# Patient Record
Sex: Female | Born: 1960 | Race: Black or African American | Hispanic: Refuse to answer | Marital: Married | Smoking: Never smoker
Health system: Southern US, Community
[De-identification: ages and names within clinical notes are randomized; demographics above are authoritative.]

## PROBLEM LIST (undated history)

## (undated) DIAGNOSIS — T7840XA Allergy, unspecified, initial encounter: Secondary | ICD-10-CM

## (undated) DIAGNOSIS — I10 Essential (primary) hypertension: Secondary | ICD-10-CM

## (undated) DIAGNOSIS — J45909 Unspecified asthma, uncomplicated: Secondary | ICD-10-CM

## (undated) DIAGNOSIS — R7302 Impaired glucose tolerance (oral): Secondary | ICD-10-CM

## (undated) HISTORY — DX: Allergy, unspecified, initial encounter: T78.40XA

## (undated) HISTORY — DX: Unspecified asthma, uncomplicated: J45.909

## (undated) HISTORY — DX: Impaired glucose tolerance (oral): R73.02

---

## 1997-11-02 ENCOUNTER — Other Ambulatory Visit: Admission: RE | Admit: 1997-11-02 | Discharge: 1997-11-02 | Payer: Self-pay | Admitting: Family Medicine

## 1998-03-21 ENCOUNTER — Encounter: Admission: RE | Admit: 1998-03-21 | Discharge: 1998-06-19 | Payer: Self-pay | Admitting: Family Medicine

## 1998-07-02 ENCOUNTER — Ambulatory Visit (HOSPITAL_COMMUNITY): Admission: RE | Admit: 1998-07-02 | Discharge: 1998-07-02 | Payer: Self-pay | Admitting: Family Medicine

## 1998-07-02 ENCOUNTER — Encounter: Payer: Self-pay | Admitting: Family Medicine

## 1998-08-13 ENCOUNTER — Encounter: Payer: Self-pay | Admitting: Family Medicine

## 1998-08-13 ENCOUNTER — Ambulatory Visit (HOSPITAL_COMMUNITY): Admission: RE | Admit: 1998-08-13 | Discharge: 1998-08-13 | Payer: Self-pay | Admitting: Family Medicine

## 1999-02-11 ENCOUNTER — Emergency Department (HOSPITAL_COMMUNITY): Admission: EM | Admit: 1999-02-11 | Discharge: 1999-02-11 | Payer: Self-pay | Admitting: Emergency Medicine

## 2000-01-09 ENCOUNTER — Emergency Department (HOSPITAL_COMMUNITY): Admission: EM | Admit: 2000-01-09 | Discharge: 2000-01-09 | Payer: Self-pay | Admitting: Emergency Medicine

## 2000-01-09 ENCOUNTER — Encounter: Payer: Self-pay | Admitting: Emergency Medicine

## 2000-11-24 ENCOUNTER — Ambulatory Visit (HOSPITAL_COMMUNITY): Admission: RE | Admit: 2000-11-24 | Discharge: 2000-11-24 | Payer: Self-pay | Admitting: Gastroenterology

## 2001-06-08 ENCOUNTER — Other Ambulatory Visit: Admission: RE | Admit: 2001-06-08 | Discharge: 2001-06-08 | Payer: Self-pay | Admitting: Family Medicine

## 2002-11-06 ENCOUNTER — Encounter: Payer: Self-pay | Admitting: Family Medicine

## 2002-11-06 ENCOUNTER — Ambulatory Visit (HOSPITAL_COMMUNITY): Admission: RE | Admit: 2002-11-06 | Discharge: 2002-11-06 | Payer: Self-pay | Admitting: Family Medicine

## 2003-12-20 ENCOUNTER — Other Ambulatory Visit: Admission: RE | Admit: 2003-12-20 | Discharge: 2003-12-20 | Payer: Self-pay | Admitting: Obstetrics and Gynecology

## 2004-07-03 ENCOUNTER — Emergency Department (HOSPITAL_COMMUNITY): Admission: EM | Admit: 2004-07-03 | Discharge: 2004-07-03 | Payer: Self-pay | Admitting: *Deleted

## 2005-08-13 ENCOUNTER — Other Ambulatory Visit: Admission: RE | Admit: 2005-08-13 | Discharge: 2005-08-13 | Payer: Self-pay | Admitting: Family Medicine

## 2007-07-30 ENCOUNTER — Emergency Department (HOSPITAL_COMMUNITY): Admission: EM | Admit: 2007-07-30 | Discharge: 2007-07-31 | Payer: Self-pay | Admitting: Emergency Medicine

## 2008-03-11 ENCOUNTER — Emergency Department (HOSPITAL_COMMUNITY): Admission: EM | Admit: 2008-03-11 | Discharge: 2008-03-11 | Payer: Self-pay | Admitting: Emergency Medicine

## 2008-05-21 ENCOUNTER — Emergency Department (HOSPITAL_COMMUNITY): Admission: EM | Admit: 2008-05-21 | Discharge: 2008-05-21 | Payer: Self-pay | Admitting: Family Medicine

## 2009-07-17 IMAGING — CT CT ABDOMEN W/ CM
1 of 3 series · 14 of 32 positions shown, 19 images · IV contrast (agent unspecified)
Comparison: None

CT ABDOMEN

CLINICAL DATA: Lower abdominal pain

CT ABDOMEN AND PELVIS WITH CONTRAST
TECHNIQUE: Multidetector CT imaging of the abdomen and pelvis was
performed using the standard protocol following bolus
administration of intravenous contrast.
Contrast: 100 ml Lmnipaque-0TT

[Series 2: abd_pel 5.0 b40f st · axial · 0.71mm/px · z∈[+760,+1155]mm · 14 of 89 slices shown, 19 images]
[im 5/89  soft-tissue]
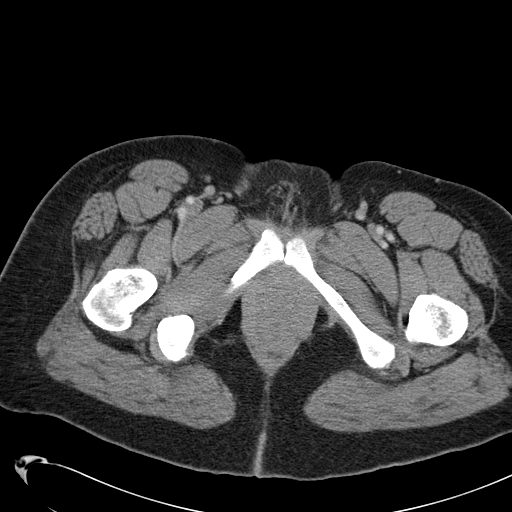
[im 5/89  bone]
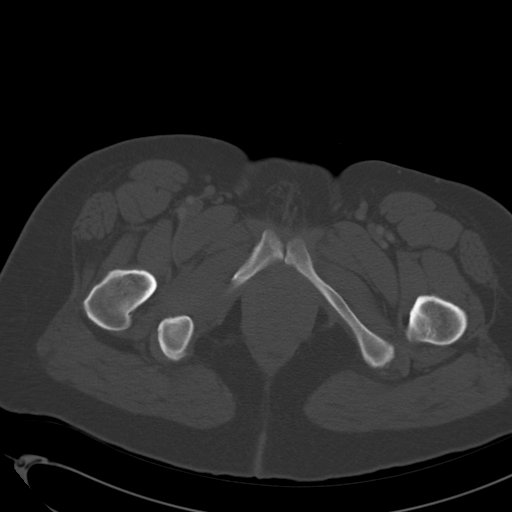
[im 14/89  soft-tissue]
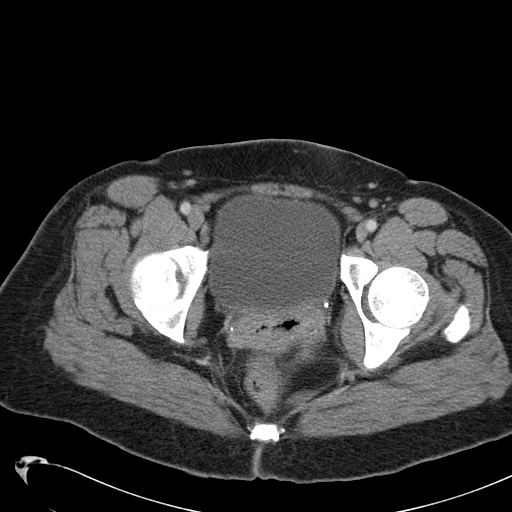
[im 19/89  soft-tissue]
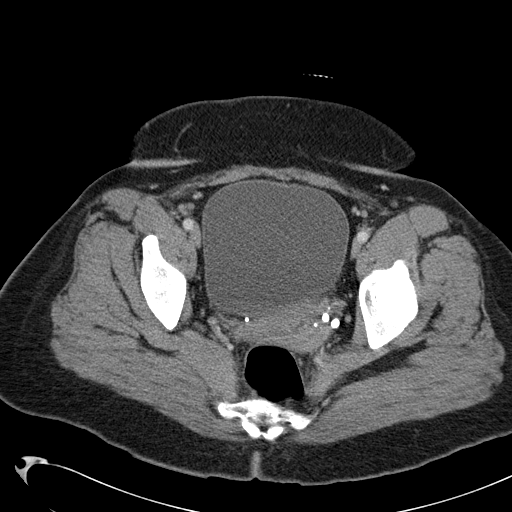
[im 24/89  soft-tissue]
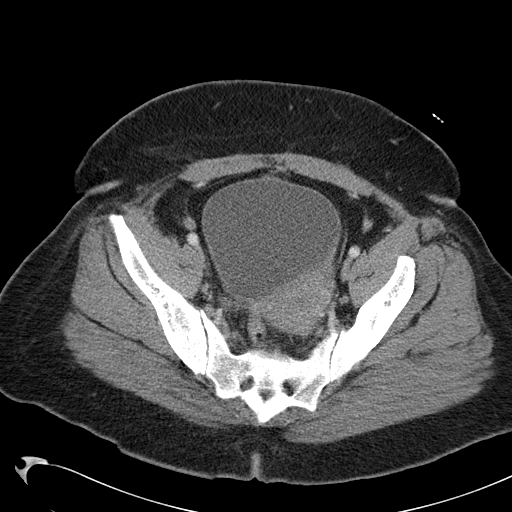
[im 33/89  soft-tissue]
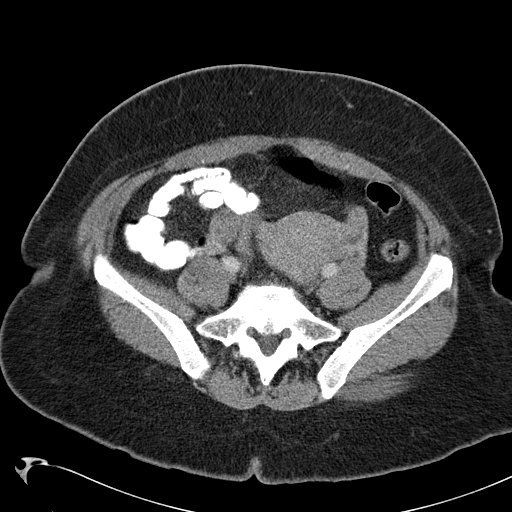
[im 38/89  soft-tissue]
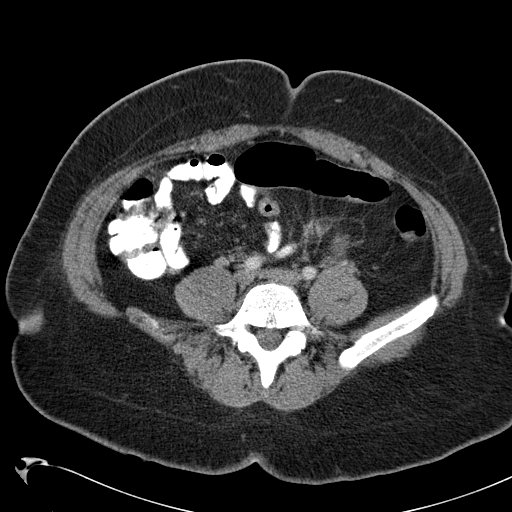
[im 47/89  soft-tissue]
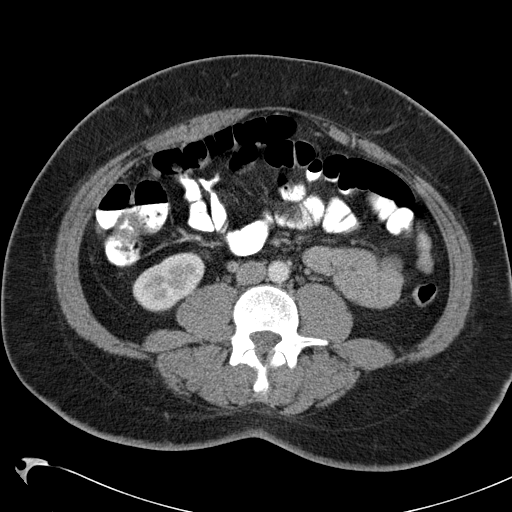
[im 51/89  soft-tissue]
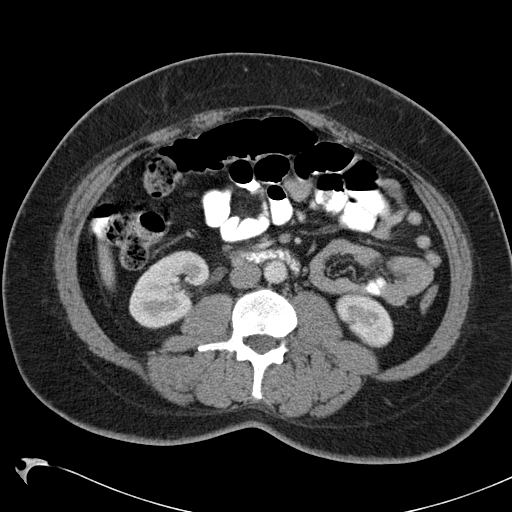
[im 56/89  soft-tissue]
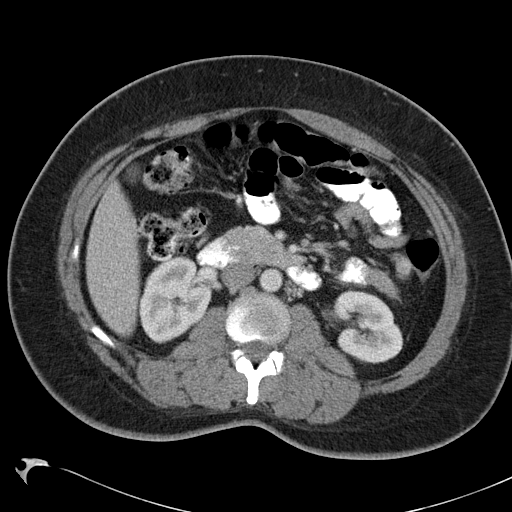
[im 56/89  bone]
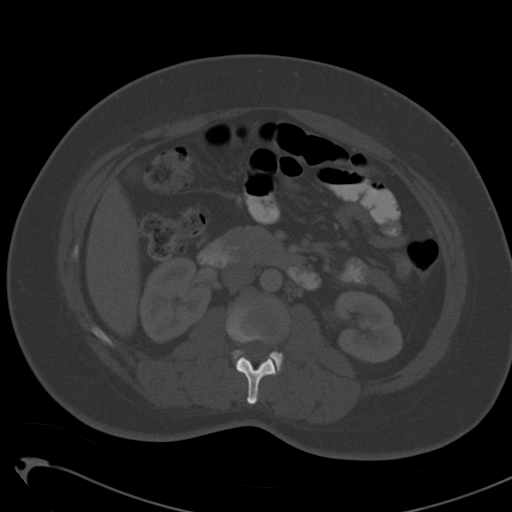
[im 65/89  soft-tissue]
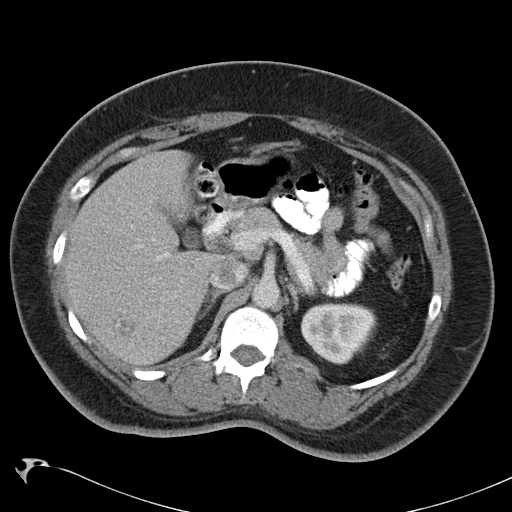
[im 70/89  soft-tissue]
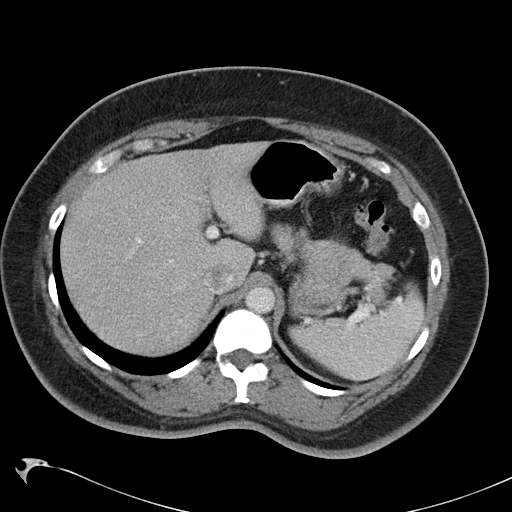
[im 70/89  lung]
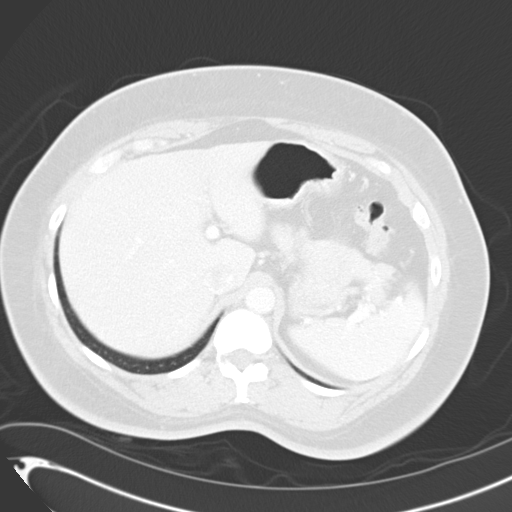
[im 75/89  soft-tissue]
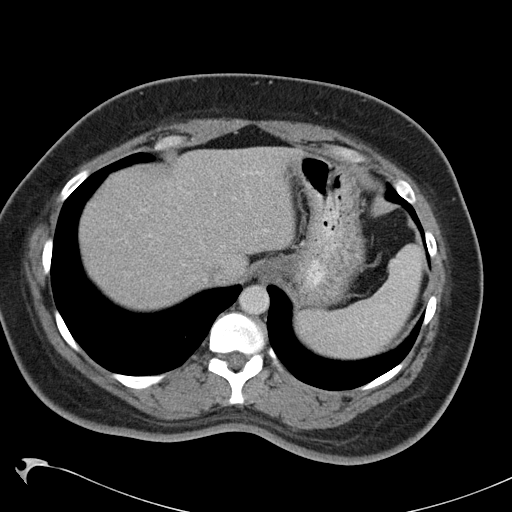
[im 75/89  lung]
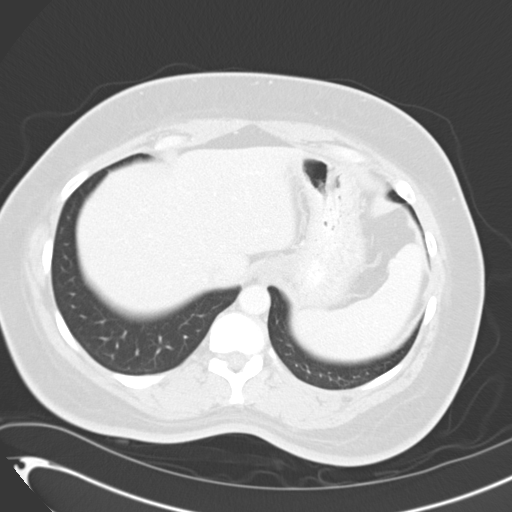
[im 79/89  lung]
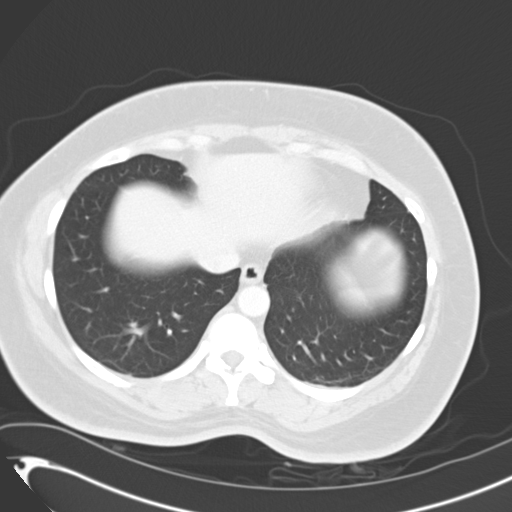
[im 84/89  soft-tissue]
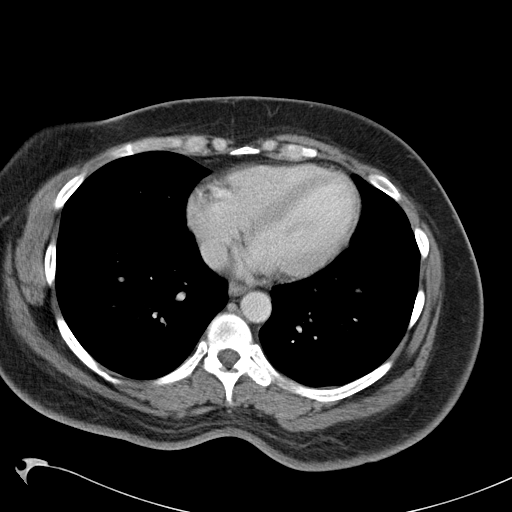
[im 84/89  lung]
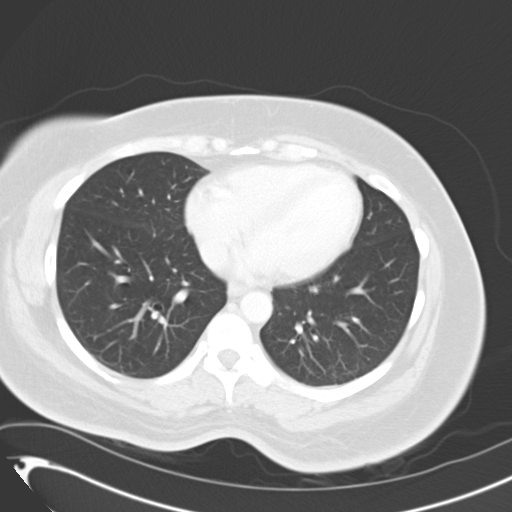

[14 of 32 positions shown; findings below may reference images not displayed]

FINDINGS: The lung bases are clear.  There is a small lesion in
the right hepatic lobe posteriorly.  This has the  CT appearance of
a benign hepatic hemangioma. No biliary dilatation.  The
gallbladder appears normal.  The pancreas is normal.  The spleen is
normal in size.  The adrenal glands and kidneys are unremarkable.

The aorta is normal in caliber.  The major branch vessels are
normal.

The stomach is not well distended but no gross abnormalities are
seen.  The duodenum, small bowel and colon are unremarkable.  No
mesenteric or retroperitoneal masses or adenopathy.  Have the
appendix is visualized and is normal.  No significant bony
findings.  Degenerative change noted at the last open interspace in
the lumbar spine.
IMPRESSION: 1.  No acute abdominal findings, mass lesions or adenopathy.
2.  Small liver lesion is most likely a benign hemangioma.  MRI
examination without and with contrast would be confirmatory.

CT PELVIS
FINDINGS: The rectum, sigmoid colon and visualized small bowel
loops are unremarkable.  The uterus and ovaries demonstrate no
significant findings.  Small cysts are noted bilaterally.  The
bladder is mildly distended.  Parametrial calcifications are noted.
No inguinal mass or hernia.  The bony pelvis is intact.
IMPRESSION: 1.  No acute pelvic findings, mass lesions or adenopathy.
2.  Mild bladder distension.

## 2010-07-29 ENCOUNTER — Ambulatory Visit (HOSPITAL_COMMUNITY)
Admission: RE | Admit: 2010-07-29 | Discharge: 2010-07-29 | Payer: Self-pay | Source: Home / Self Care | Attending: Chiropractic Medicine | Admitting: Chiropractic Medicine

## 2010-12-04 NOTE — Procedures (Signed)
[place]  Patient:    Tiffany Contreras, Tiffany Contreras                    MRN: [id number] Proc. Date: 11/24/00 Attending:  Verlin Grills, M.D. CC:         Stacie Acres. Cliffton Asters, M.D., [facility]                           Procedure Report  PROCEDURE:  Colonoscopy.  REFERRING PHYSICIAN:  Stacie Acres. Cliffton Asters, M.D., [facility], [address] [place], [address], [place], [place] [address].  PROCEDURE INDICATION:  Tiffany Contreras (date of birth September 17, 1960) is a 50 year old female who had an episode of painless hematochezia.  I discussed with her the complications associated with colonoscopy and polypectomy, including intestinal bleeding and intestinal perforation.  Ms. Verdell has signed the operative permit.  ENDOSCOPIST:  Verlin Grills, M.D.  PREMEDICATION:  Fentanyl 50 mcg, Versed 5 mg.  ENDOSCOPE:  Olympus pediatric colonoscope.  DESCRIPTION OF PROCEDURE:  After obtaining informed consent, the patient was placed in the left lateral decubitus position.  I administered intravenous fentanyl and intravenous Versed to achieve conscious sedation for the procedure.  The patients blood pressure, oxygen saturation, and cardiac rhythm were monitored throughout the procedure and documented in the medical record.  Anal inspection was normal.  Digital rectal exam was normal.  The Olympus pediatric video colonoscope was introduced into the rectum and under direct vision advanced to the cecum, as identified by a normal-appearing ileocecal valve.  The ileocecal valve was intubated and the distal ileum inspected. Colonic preparation for the exam today was excellent.  Rectum normal.  Sigmoid colon and descending colon normal.  Splenic flexure normal.  Transverse colon normal.  Hepatic flexure normal.  Ascending colon normal.  Cecum and ileocecal valve normal.  Distal ileum  normal.  ASSESSMENT:  Normal proctocolonoscopy to the cecum with intubation of the ileocecal valve and distal ileal inspection. DD:  11/24/00 TD:  11/24/00 Job: [id number]

## 2011-03-05 ENCOUNTER — Other Ambulatory Visit: Payer: Self-pay | Admitting: Emergency Medicine

## 2011-08-24 ENCOUNTER — Ambulatory Visit (INDEPENDENT_AMBULATORY_CARE_PROVIDER_SITE_OTHER): Payer: BC Managed Care – PPO | Admitting: Family Medicine

## 2011-08-24 VITALS — BP 142/86 | HR 77 | Temp 97.8°F | Resp 16 | Ht 62.0 in | Wt 184.8 lb

## 2011-08-24 DIAGNOSIS — G4700 Insomnia, unspecified: Secondary | ICD-10-CM

## 2011-08-24 DIAGNOSIS — F411 Generalized anxiety disorder: Secondary | ICD-10-CM

## 2011-08-24 DIAGNOSIS — G43909 Migraine, unspecified, not intractable, without status migrainosus: Secondary | ICD-10-CM

## 2011-08-24 DIAGNOSIS — F419 Anxiety disorder, unspecified: Secondary | ICD-10-CM

## 2011-08-24 MED ORDER — ELETRIPTAN HYDROBROMIDE 20 MG PO TABS: ORAL_TABLET | ORAL | Status: DC

## 2011-08-24 MED ORDER — CITALOPRAM HYDROBROMIDE 20 MG PO TABS: 20.0000 mg | ORAL_TABLET | Freq: Every day | ORAL | Status: DC

## 2011-08-24 NOTE — Progress Notes (Signed)
  Subjective:    Patient ID: Tiffany Contreras, female    DOB: [birth year], 51 y.o.   MRN: [id number]  HPI 51 yo female with GI complaints for 3 days (started Sunday).  Diarrhea, nausea, vomitting.  That stopped yesterday.  Then rectal bleeding yesterday and this morning.  Paitent not worried.  Last colonscopy 1 year ago - normal.  History of hemorrhoids and fissures.  Patient actually not concerned about these symptoms.  Actually wants to discuss:  Today worried about: 1) Anxiety, insomnia -  In march was prescribed prozac and klonopn.  Never took.  Mother in law told her it could cause seizures and she thought they would "maker her crazy". Still has a lot of stress and anxiety.  Makes her irritable and on edge.  Can't fall asleep and wakes up multiple times a night.  Does not want anything "addicting".   2) Migraines - usual with menses, every few months.  Relpax helped a lot.  Out.  Hasn't had in over a year.     Review of Systems Negative except as per HPI     Objective:   Physical Exam  Constitutional: She appears well-developed and well-nourished.  Cardiovascular: Normal rate, regular rhythm, normal heart sounds and intact distal pulses.   Pulmonary/Chest: Effort normal and breath sounds normal.  Neurological: She is alert.  Psychiatric: Her speech is normal. Her mood appears anxious. She is withdrawn.          Assessment & Plan:  Anxiety - celexa.  F/u 4-6 weeks Insomnia - try melatonin.  If not helpful, RTC Migrain - relpax.

## 2012-01-21 ENCOUNTER — Ambulatory Visit (INDEPENDENT_AMBULATORY_CARE_PROVIDER_SITE_OTHER): Payer: BC Managed Care – PPO | Admitting: Emergency Medicine

## 2012-01-21 VITALS — BP 138/88 | HR 85 | Temp 98.1°F | Resp 18 | Ht 63.0 in | Wt 188.0 lb

## 2012-01-21 DIAGNOSIS — H00019 Hordeolum externum unspecified eye, unspecified eyelid: Secondary | ICD-10-CM

## 2012-01-21 MED ORDER — ERYTHROMYCIN 5 MG/GM OP OINT: TOPICAL_OINTMENT | Freq: Every day | OPHTHALMIC | Status: AC

## 2012-01-21 NOTE — Progress Notes (Signed)
  Subjective:    Patient ID: Tiffany Contreras, female    DOB: January 08, 1961, 51 y.o.   MRN: [id number]  HPI Pt's left eye has sharp pain.  A few days later she noticed a bump on her lower lid.  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  Pt currently has no pain in eye for about one week.     Review of Systems     Objective:   Physical Exam there is a 2 x 3 mm raised in left mid and lower lid. Pupils are equal reactive to light. Lid eversion is negative. The disc is normal .        Assessment & Plan:  Patient is a side left lower lid we'll treat with erythromycin ointment .

## 2012-01-27 ENCOUNTER — Telehealth: Payer: Self-pay

## 2012-01-27 NOTE — Telephone Encounter (Signed)
PT STATES SHE WAS SEEN FOR HER EYE AND WE WERE GOING TO CALL IN SOME DROPS FOR HER, BUT THE PHARMACY NEVER RECEIVED THE REQUEST PLEASE CALL [phone] AND LEAVE ON VOICE MAIL  [facility] [address]

## 2012-01-28 ENCOUNTER — Telehealth: Payer: Self-pay

## 2012-01-28 NOTE — Telephone Encounter (Signed)
Called pharmacy to check status of Rx and was told they did receive Rx and it was on hold. Asked them to fill Rx for pt and to clarify on sig that pt is to apply TID, not just at bedtime. LMOM for pt that the pharmacy had the Rx on hold and is filling it for her.

## 2012-01-29 ENCOUNTER — Other Ambulatory Visit: Payer: Self-pay | Admitting: Family Medicine

## 2012-05-17 ENCOUNTER — Other Ambulatory Visit: Payer: Self-pay | Admitting: Family Medicine

## 2012-06-03 ENCOUNTER — Emergency Department (HOSPITAL_COMMUNITY): Payer: BC Managed Care – PPO

## 2012-06-03 ENCOUNTER — Emergency Department (HOSPITAL_COMMUNITY)
Admission: EM | Admit: 2012-06-03 | Discharge: 2012-06-03 | Disposition: A | Discharge status: Home / Self Care | Payer: BC Managed Care – PPO | Attending: Emergency Medicine | Admitting: Emergency Medicine

## 2012-06-03 ENCOUNTER — Encounter (HOSPITAL_COMMUNITY): Payer: Self-pay | Admitting: Emergency Medicine

## 2012-06-03 DIAGNOSIS — Y9301 Activity, walking, marching and hiking: Secondary | ICD-10-CM | POA: Insufficient documentation

## 2012-06-03 DIAGNOSIS — Y9289 Other specified places as the place of occurrence of the external cause: Secondary | ICD-10-CM | POA: Insufficient documentation

## 2012-06-03 DIAGNOSIS — W010XXA Fall on same level from slipping, tripping and stumbling without subsequent striking against object, initial encounter: Secondary | ICD-10-CM | POA: Insufficient documentation

## 2012-06-03 DIAGNOSIS — S060XAA Concussion with loss of consciousness status unknown, initial encounter: Secondary | ICD-10-CM | POA: Insufficient documentation

## 2012-06-03 DIAGNOSIS — I10 Essential (primary) hypertension: Secondary | ICD-10-CM | POA: Insufficient documentation

## 2012-06-03 DIAGNOSIS — S060X9A Concussion with loss of consciousness of unspecified duration, initial encounter: Secondary | ICD-10-CM | POA: Insufficient documentation

## 2012-06-03 HISTORY — DX: Essential (primary) hypertension: I10

## 2012-06-03 LAB — URINALYSIS, ROUTINE W REFLEX MICROSCOPIC
Bilirubin Urine: NEGATIVE
Leukocytes, UA: NEGATIVE
Nitrite: NEGATIVE
Specific Gravity, Urine: 1.016 (ref 1.005–1.030)
Urobilinogen, UA: 0.2 mg/dL (ref 0.0–1.0)
pH: 7.5 (ref 5.0–8.0)

## 2012-06-03 MED ORDER — OXYCODONE-ACETAMINOPHEN 5-325 MG PO TABS
1.0000 | ORAL_TABLET | Freq: Once | ORAL | Status: AC
  Administered 2012-06-03: 1 via ORAL
  Filled 2012-06-03: qty 1

## 2012-06-03 MED ORDER — OXYCODONE-ACETAMINOPHEN 5-325 MG PO TABS: 1.0000 | ORAL_TABLET | Freq: Four times a day (QID) | ORAL | Status: DC | PRN

## 2012-06-03 NOTE — ED Notes (Signed)
ZOX:WRUE<AV> Expected date:<BR> Expected time:<BR> Means of arrival:<BR> Comments:<BR> Hold for triage 1

## 2012-06-03 NOTE — ED Provider Notes (Signed)
History     CSN: [id number]  Arrival date & time 06/03/12  1752   First MD Initiated Contact with Patient 06/03/12 1930      Chief Complaint  Patient presents with  . Fall  . Headache    (Consider location/radiation/quality/duration/timing/severity/associated sxs/prior treatment) Patient is a 51 y.o. female presenting with fall and headaches. The history is provided by the patient.  Fall Associated symptoms include headaches. Pertinent negatives include no numbness, no abdominal pain, no nausea and no vomiting.  Headache  Pertinent negatives include no shortness of breath, no nausea and no vomiting.   on patient states that she slipped wall walking downhill on Thursday and fell back hitting her head. She states she got days at the time and felt a little weak. Since she's developed headache and some photophobia. No numbness or weakness. Her headaches have not improved. No vomiting. No nausea since. The headache is on the back right side her head and goes forward. No neck pain. No chest or abdominal pain. She is not on anticoagulation  Past Medical History  Diagnosis Date  . Hypertension     Past Surgical History  Procedure Date  . Bladder repair w/ cesarean section   . Cesarean section     NO BLADDER REPAIR    No family history on file.  History  Substance Use Topics  . Smoking status: Never Smoker   . Smokeless tobacco: Not on file  . Alcohol Use: Not on file    OB History    Grav Para Term Preterm Abortions TAB SAB Ect Mult Living                  Review of Systems  Constitutional: Negative for activity change and appetite change.  HENT: Negative for neck stiffness.   Eyes: Positive for photophobia. Negative for pain.  Respiratory: Negative for chest tightness and shortness of breath.   Cardiovascular: Negative for chest pain and leg swelling.  Gastrointestinal: Negative for nausea, vomiting, abdominal pain and diarrhea.  Genitourinary: Negative for flank  pain.  Musculoskeletal: Negative for back pain.  Skin: Negative for rash.  Neurological: Positive for headaches. Negative for weakness and numbness.  Psychiatric/Behavioral: Negative for behavioral problems.    Allergies  Penicillins  Home Medications   Current Outpatient Rx  Name  Route  Sig  Dispense  Refill  . TRIAMTERENE-HCTZ 37.5-25 MG PO TABS   Oral   Take 1 tablet by mouth daily.         . OXYCODONE-ACETAMINOPHEN 5-325 MG PO TABS   Oral   Take 1-2 tablets by mouth every 6 (six) hours as needed for pain.   8 tablet   0     BP 169/85  Pulse 87  SpO2 100%  Physical Exam  Nursing note and vitals reviewed. Constitutional: She is oriented to person, place, and time. She appears well-developed and well-nourished.  HENT:  Head: Normocephalic and atraumatic.  Eyes: EOM are normal. Pupils are equal, round, and reactive to light.  Neck: Normal range of motion. Neck supple.  Cardiovascular: Normal rate, regular rhythm and normal heart sounds.   No murmur heard. Pulmonary/Chest: Effort normal and breath sounds normal. No respiratory distress. She has no wheezes. She has no rales.  Abdominal: Soft. Bowel sounds are normal. She exhibits no distension. There is no tenderness. There is no rebound and no guarding.  Musculoskeletal: Normal range of motion.       Mild tenderness to left buttock.  Neurological: She is  alert and oriented to person, place, and time. No cranial nerve deficit.  Skin: Skin is warm and dry.  Psychiatric: She has a normal mood and affect. Her speech is normal.    ED Course  Procedures (including critical care time)   Labs Reviewed  URINALYSIS, ROUTINE W REFLEX MICROSCOPIC   Ct Head Wo Contrast  06/03/2012  *RADIOLOGY REPORT*  Clinical Data: Larey Seat 2 days ago.  Continued headache with photophobia.  CT HEAD WITHOUT CONTRAST  Technique:  Contiguous axial images were obtained from the base of the skull through the vertex without contrast.  Comparison:  None.  Findings: There is no evidence for acute infarction, intracranial hemorrhage, mass lesion, hydrocephalus, or extra-axial fluid. There is no atrophy or white matter disease.  The calvarium is intact. No visible scalp hematoma.  Clear sinuses and mastoids.  IMPRESSION: Negative.   Original Report Authenticated By: Davonna Belling, M.D.      1. Concussion       MDM  Patient with headache and photophobia after hitting her head on Thursday. Negative head CT. Likely concussion        Juliet Rude. Rubin Payor, MD 06/04/12 7425

## 2012-06-03 NOTE — ED Notes (Signed)
Slid down hill on Thursday, hit back of head on ground-- no LOC, but states was "blurry eyed" for a short time, still has a severe headache--

## 2012-06-08 ENCOUNTER — Ambulatory Visit (INDEPENDENT_AMBULATORY_CARE_PROVIDER_SITE_OTHER): Payer: BC Managed Care – PPO | Admitting: Emergency Medicine

## 2012-06-08 VITALS — BP 146/88 | HR 108 | Temp 98.1°F | Resp 18 | Ht 62.5 in | Wt 187.0 lb

## 2012-06-08 DIAGNOSIS — G44309 Post-traumatic headache, unspecified, not intractable: Secondary | ICD-10-CM

## 2012-06-08 MED ORDER — NAPROXEN SODIUM 550 MG PO TABS: 550.0000 mg | ORAL_TABLET | Freq: Two times a day (BID) | ORAL | Status: DC

## 2012-06-08 MED ORDER — EPINEPHRINE 0.3 MG/0.3ML IJ DEVI: 0.3000 mg | Freq: Once | INTRAMUSCULAR | Status: DC

## 2012-06-08 NOTE — Progress Notes (Signed)
[facility] [address] [place] [address] [phone]  Date:  06/08/2012   Name:  Tiffany Contreras   DOB:  03-24-1961   MRN:  [id number]  PCP:  No primary provider on file.    Chief Complaint: Follow-up and Medication Refill   History of Present Illness:  Tiffany Contreras is a 51 y.o. very pleasant female patient who presents with the following:  Slipped and fell and hit head last Thursday.  Was seen in ER Saturday and had a normal exam and CT of head at that time and discharged on percocet for headache.  Unable to tolerate percocet and says that it did not relieve her headache.  Now has headache that is improved and and not relieved with ibuprofen.  Denies photophobia or other neuro complaints.  Had some nausea and vomiting a two days ago that has resolved.  Says that she had her "throat close up" while taking the percocet and her son is allergic to the drug. No sore throat, fever or chills, cough or coryza.  No difficulty with gait balance or coordination or speech or vision.  There is no problem list on file for this patient.   Past Medical History  Diagnosis Date  . Hypertension     Past Surgical History  Procedure Date  . Bladder repair w/ cesarean section   . Cesarean section     NO BLADDER REPAIR    History  Substance Use Topics  . Smoking status: Never Smoker   . Smokeless tobacco: Not on file  . Alcohol Use: Not on file    No family history on file.  Allergies  Allergen Reactions  . Penicillins     unknown    Medication list has been reviewed and updated.  Current Outpatient Prescriptions on File Prior to Visit  Medication Sig Dispense Refill  . oxyCODONE-acetaminophen (PERCOCET/ROXICET) 5-325 MG per tablet Take 1-2 tablets by mouth every 6 (six) hours as needed for pain.  8 tablet  0  . triamterene-hydrochlorothiazide (MAXZIDE-25) 37.5-25 MG per tablet Take 1 tablet by mouth daily.        Review of Systems:  As per  HPI, otherwise negative.    Physical Examination: Filed Vitals:   06/08/12 0819  BP: 146/88  Pulse: 108  Temp: 98.1 F (36.7 C)  Resp: 18   Filed Vitals:   06/08/12 0819  Height: 5' 2.5" (1.588 m)  Weight: 187 lb (84.823 kg)   Body mass index is 33.66 kg/(m^2). Ideal Body Weight: Weight in (lb) to have BMI = 25: 138.6   GEN: WDWN, NAD, Non-toxic, A & O x 3.  No rash or sepsis.   HEENT: Atraumatic, Normocephalic. Neck supple. No masses, No LAD.  PRRERLA EOMI  Fundi normal  Oropharynx negative Ears and Nose: No external deformity.  TM negative CV: RRR, No M/G/R. No JVD. No thrill. No extra heart sounds. PULM: CTA B, no wheezes, crackles, rhonchi. No retractions. No resp. distress. No accessory muscle use. ABD: S, NT, ND, +BS. No rebound. No HSM. EXTR: No c/c/e NEURO Normal gait. CN 2-12 intact.   PSYCH: Normally interactive. Conversant. Not depressed or anxious appearing.  Calm demeanor.    Assessment and Plan: Post concussion headache Anaprox Follow up as needed  Carmelina Dane, MD

## 2012-06-12 ENCOUNTER — Encounter (HOSPITAL_COMMUNITY): Payer: Self-pay | Admitting: *Deleted

## 2012-06-12 ENCOUNTER — Emergency Department (HOSPITAL_COMMUNITY)
Admission: EM | Admit: 2012-06-12 | Discharge: 2012-06-13 | Disposition: A | Discharge status: Home / Self Care | Payer: BC Managed Care – PPO | Attending: Emergency Medicine | Admitting: Emergency Medicine

## 2012-06-12 DIAGNOSIS — F0781 Postconcussional syndrome: Secondary | ICD-10-CM | POA: Insufficient documentation

## 2012-06-12 DIAGNOSIS — Z79899 Other long term (current) drug therapy: Secondary | ICD-10-CM | POA: Insufficient documentation

## 2012-06-12 DIAGNOSIS — I10 Essential (primary) hypertension: Secondary | ICD-10-CM

## 2012-06-12 NOTE — ED Notes (Signed)
Pt states she was seen 11/16 for head injury and sent home on percocet, states she then had an allergic reaction to it and started taking naproxen and thought it "messed" with her also. Pt states since that time her blood pressure has been elevated and is c/o headaches.

## 2012-06-13 MED ORDER — IBUPROFEN 800 MG PO TABS: 800.0000 mg | ORAL_TABLET | Freq: Three times a day (TID) | ORAL | Status: DC | PRN

## 2012-06-13 MED ORDER — IBUPROFEN 800 MG PO TABS
800.0000 mg | ORAL_TABLET | Freq: Once | ORAL | Status: AC
  Administered 2012-06-13: 800 mg via ORAL
  Filled 2012-06-13: qty 1

## 2012-06-13 NOTE — ED Provider Notes (Signed)
History     CSN: [id number]  Arrival date & time 06/12/12  2015   First MD Initiated Contact with Patient 06/13/12 0017      Chief Complaint  Patient presents with  . Hypertension    (Consider location/radiation/quality/duration/timing/severity/associated sxs/prior treatment) HPI Is a 51 year old female who was seen on the 16th of this month for a head injury. She was diagnosed with concussion and sent home on Percocet. He states that Percocet made her itch and she stopped taking it. She saw her primary care physician who put her on Anaprox but she states that made her itching as well. She has not taken it in about 3 days. She continues to have periodic headaches but is no longer having dizzy spells like she had soon after the accident. He has had some headaches or moderate to severe but states her headache is mild to moderate now. She took her blood pressure earlier found to be 186/100. That was her principal reason for being evaluated. It is noted that her blood pressure was 170/97 in triage. She continues to take her Maxzide. She denies chest pain, shortness of breath, visual changes or neurologic symptoms at the present time. She had been treating her itching with Benadryl.  Past Medical History  Diagnosis Date  . Hypertension     Past Surgical History  Procedure Date  . Bladder repair w/ cesarean section   . Cesarean section     NO BLADDER REPAIR    History reviewed. No pertinent family history.  History  Substance Use Topics  . Smoking status: Never Smoker   . Smokeless tobacco: Not on file  . Alcohol Use: Not on file    OB History    Grav Para Term Preterm Abortions TAB SAB Ect Mult Living                  Review of Systems  All other systems reviewed and are negative.    Allergies  Anaprox; Penicillins; and Percocet  Home Medications   Current Outpatient Rx  Name  Route  Sig  Dispense  Refill  . EPINEPHRINE 0.3 MG/0.3ML IJ DEVI   Intramuscular  Inject 0.3 mLs (0.3 mg total) into the muscle once.   2 Device   2   . IBUPROFEN 200 MG PO TABS   Oral   Take 400 mg by mouth every 6 (six) hours as needed. Pain         . NAPROXEN SODIUM 550 MG PO TABS   Oral   Take 550 mg by mouth 2 (two) times daily with a meal.         . OXYCODONE-ACETAMINOPHEN 5-325 MG PO TABS   Oral   Take 1-2 tablets by mouth every 6 (six) hours as needed for pain.   8 tablet   0   . TRIAMTERENE-HCTZ 37.5-25 MG PO TABS   Oral   Take 1 tablet by mouth daily.           BP 170/97  Pulse 90  Temp 98.5 F (36.9 C) (Oral)  Resp 20  SpO2 100%  LMP 06/01/2012  Physical Exam General: Well-developed, well-nourished female in no acute distress; appearance consistent with age of record HENT: normocephalic, atraumatic Eyes: pupils equal round and reactive to light; extraocular muscles intact Neck: supple Heart: regular rate and rhythm Lungs: clear to auscultation bilaterally Abdomen: soft; nondistended Extremities: No deformity; full range of motion; pulses normal; no edema Neurologic: Awake, alert and oriented; motor function intact in  all extremities and symmetric; no facial droop Skin: Warm and dry Psychiatric: Normal mood and affect    ED Course  Procedures (including critical care time)    MDM  12:27 AM Patient states she can tolerate the 800 mg ibuprofen tablets. She requests a prescription for same. She was advised that NSAIDs can cause some pupils blood pressure to be elevated. She was advised to monitor this and followup with her primary care physician.        Hanley Seamen, MD 06/13/12 [phone]

## 2012-06-14 ENCOUNTER — Telehealth: Payer: Self-pay

## 2012-06-14 NOTE — Telephone Encounter (Signed)
PT WAS SEEN LAST Thursday FOR A FOLLOW UP ON A CONCUSSION.  EVER SINCE SHE HAD THE HEAD INJURY HER BP HAS BEEN ELEVATED.  SHE SAYS SHE TOLD THE DOCTOR AT THE TIME ABOUT IT.  SHE WANTS TO KNOW IF SHE SHOULD COME IN FOR BLOOD WORK OR ANYTHING IN REGARDS TO THIS.  SAID NO BLOOD WAS TAKEN LAST Thursday.  PLEASE CALL [phone]

## 2012-06-14 NOTE — Telephone Encounter (Signed)
She states she was seen in ER for this, I advised her she does need to be seen for elevated blood pressure.

## 2012-06-17 ENCOUNTER — Encounter: Payer: Self-pay | Admitting: Family Medicine

## 2012-06-17 ENCOUNTER — Ambulatory Visit (INDEPENDENT_AMBULATORY_CARE_PROVIDER_SITE_OTHER): Payer: BC Managed Care – PPO | Admitting: Family Medicine

## 2012-06-17 VITALS — BP 148/94 | HR 94 | Temp 98.6°F | Resp 20 | Ht 63.5 in | Wt 191.6 lb

## 2012-06-17 DIAGNOSIS — F419 Anxiety disorder, unspecified: Secondary | ICD-10-CM

## 2012-06-17 DIAGNOSIS — R079 Chest pain, unspecified: Secondary | ICD-10-CM

## 2012-06-17 DIAGNOSIS — F411 Generalized anxiety disorder: Secondary | ICD-10-CM

## 2012-06-17 DIAGNOSIS — I10 Essential (primary) hypertension: Secondary | ICD-10-CM | POA: Insufficient documentation

## 2012-06-17 DIAGNOSIS — F0781 Postconcussional syndrome: Secondary | ICD-10-CM

## 2012-06-17 MED ORDER — AMLODIPINE BESYLATE 5 MG PO TABS: 5.0000 mg | ORAL_TABLET | Freq: Every day | ORAL | Status: DC

## 2012-06-17 NOTE — Patient Instructions (Signed)
Return in one week for recheck.  If at anytime you're abruptly worse return or go to the emergency room  And amlodipine one tablet daily to your current medication  Work on weight loss and salt restriction, and when you're feeling better start doing some regular walking.

## 2012-06-17 NOTE — Progress Notes (Signed)
Subjective: 51 year old lady who slipped going down a bank at daycare 2 weeks ago and hit the back of her head. She had a concussion. She went to the emergency room. She is persisted in having headaches, right side of her hand behind her right eye. Her blood pressures been running high. She's been back to the emergency room twice, and here in the office once for this. Yesterday she had a numbness in left hand. Last night she had some substernal chest pressure. She gone out to dinner and a few things yesterday. She decided to come in and get checked this morning.  Objective: Anxious appearing. In no acute distress. Eyes PERRLA fundi benign. Neck supple. Chest is clear to auscultation. Heart regular without murmurs. Grip is symmetrical. Finger to nose normal. Romberg negative. Gait good. Patient is fully alert and oriented.  EKG normal. I repeated the blood pressure and got 170/104, then repeated again after the EKG and got 148/94.  On repeat by the nurse blood pressure is down to 140/90.  Assessment: Postconcussion syndrome Hypertension Anxiety  Plan: And the blood pressure medicine Reassurance If in all worse she is to return here to the emergency room. We'll recheck her in one week.

## 2012-06-18 NOTE — Progress Notes (Signed)
Reviewed and agree.

## 2012-09-21 ENCOUNTER — Other Ambulatory Visit: Payer: Self-pay | Admitting: Family Medicine

## 2013-01-10 ENCOUNTER — Ambulatory Visit (INDEPENDENT_AMBULATORY_CARE_PROVIDER_SITE_OTHER): Payer: BC Managed Care – PPO | Admitting: Physician Assistant

## 2013-01-10 VITALS — BP 124/82 | HR 86 | Temp 98.0°F | Resp 18 | Ht 63.0 in | Wt 195.0 lb

## 2013-01-10 DIAGNOSIS — Z124 Encounter for screening for malignant neoplasm of cervix: Secondary | ICD-10-CM

## 2013-01-10 DIAGNOSIS — N760 Acute vaginitis: Secondary | ICD-10-CM

## 2013-01-10 DIAGNOSIS — K649 Unspecified hemorrhoids: Secondary | ICD-10-CM

## 2013-01-10 DIAGNOSIS — K5900 Constipation, unspecified: Secondary | ICD-10-CM

## 2013-01-10 DIAGNOSIS — K625 Hemorrhage of anus and rectum: Secondary | ICD-10-CM

## 2013-01-10 LAB — IFOBT (OCCULT BLOOD): IFOBT: NEGATIVE

## 2013-01-10 MED ORDER — DOCUSATE SODIUM 50 MG PO CAPS: 50.0000 mg | ORAL_CAPSULE | Freq: Two times a day (BID) | ORAL | Status: DC

## 2013-01-10 NOTE — Patient Instructions (Addendum)
Begin taking the Colace (docusate) twice daily to help relieve constipation.  I suspect that the bleeding you experienced last week was due to hemorrhoids.  There is no blood in your stool today, which is reassuring.  If the bleeding returns, I may want you to see GI again to rule out anything higher up in your colon.    You can stop taking the Norvasc (amlodipine), but continue the Maxide daily.  Check your blood pressure at least 3 times per week over the next two weeks.  If you see numbers >140/90 we may need to add the amlodipine back or alter your meds to get the BP down.  I will let you know when your pap results are in.   Hemorrhoids Hemorrhoids are swollen veins around the rectum or anus. There are two types of hemorrhoids:   Internal hemorrhoids. These occur in the veins just inside the rectum. They may poke through to the outside and become irritated and painful.  External hemorrhoids. These occur in the veins outside the anus and can be felt as a painful swelling or hard lump near the anus. CAUSES  Pregnancy.   Obesity.   Constipation or diarrhea.   Straining to have a bowel movement.   Sitting for long periods on the toilet.  Heavy lifting or other activity that caused you to strain.  Anal intercourse. SYMPTOMS   Pain.   Anal itching or irritation.   Rectal bleeding.   Fecal leakage.   Anal swelling.   One or more lumps around the anus.  DIAGNOSIS  Your caregiver may be able to diagnose hemorrhoids by visual examination. Other examinations or tests that may be performed include:   Examination of the rectal area with a gloved hand (digital rectal exam).   Examination of anal canal using a small tube (scope).   A blood test if you have lost a significant amount of blood.  A test to look inside the colon (sigmoidoscopy or colonoscopy). TREATMENT Most hemorrhoids can be treated at home. However, if symptoms do not seem to be getting better or if  you have a lot of rectal bleeding, your caregiver may perform a procedure to help make the hemorrhoids get smaller or remove them completely. Possible treatments include:   Placing a rubber band at the base of the hemorrhoid to cut off the circulation (rubber band ligation).   Injecting a chemical to shrink the hemorrhoid (sclerotherapy).   Using a tool to burn the hemorrhoid (infrared light therapy).   Surgically removing the hemorrhoid (hemorrhoidectomy).   Stapling the hemorrhoid to block blood flow to the tissue (hemorrhoid stapling).  HOME CARE INSTRUCTIONS   Eat foods with fiber, such as whole grains, beans, nuts, fruits, and vegetables. Ask your doctor about taking products with added fiber in them (fibersupplements).  Increase fluid intake. Drink enough water and fluids to keep your urine clear or pale yellow.   Exercise regularly.   Go to the bathroom when you have the urge to have a bowel movement. Do not wait.   Avoid straining to have bowel movements.   Keep the anal area dry and clean. Use wet toilet paper or moist towelettes after a bowel movement.   Medicated creams and suppositories may be used or applied as directed.   Only take over-the-counter or prescription medicines as directed by your caregiver.   Take warm sitz baths for 15 20 minutes, 3 4 times a day to ease pain and discomfort.   Place ice packs  on the hemorrhoids if they are tender and swollen. Using ice packs between sitz baths may be helpful.   Put ice in a plastic bag.   Place a towel between your skin and the bag.   Leave the ice on for 15 20 minutes, 3 4 times a day.   Do not use a donut-shaped pillow or sit on the toilet for long periods. This increases blood pooling and pain.  SEEK MEDICAL CARE IF:  You have increasing pain and swelling that is not controlled by treatment or medicine.  You have uncontrolled bleeding.  You have difficulty or you are unable to have a  bowel movement.  You have pain or inflammation outside the area of the hemorrhoids. MAKE SURE YOU:  Understand these instructions.  Will watch your condition.  Will get help right away if you are not doing well or get worse. Document Released: 07/02/2000 Document Revised: 06/21/2012 Document Reviewed: 05/09/2012 [facility] Patient Information 2014 [place], [place].

## 2013-01-10 NOTE — Progress Notes (Addendum)
Subjective:    Patient ID: Tiffany Contreras, female    DOB: 03-12-1961, 52 y.o.   MRN: [id number]  HPI   Tiffany Contreras is a very pleasant 52 yr old female here with several concerns today.  (1)  Would like pap test today.  Thinks last pap was 2-3 yrs ago at [facility].  Reports that this was normal.  Not sure if she has ever had an abnormal pap in the past - thinks maybe 10 yrs ago there was an abnormal?  Reports that she has never had a colpo or procedure done.  Still has regular periods every month, LMP 12/28/12.  No concern for STIs.  Just had mammo in May 2014, reports this was normal.  Does do SBEs every month.  (2)  States she had about 5 days of BRBPR with bowel movements last week.  Noted blood in the toilet bowl.  Denies pain.  States she has a long history of hemorrhoids.  Had hemorrhoid surgery when she was pregnant with her first child about 30 yrs ago.  Has several external hemorrhoids that are not bothersome to her.  Does note more constipation recently.  Otherwise no change in bowel habits.  No change in stool caliber.  No abd, nausea, vomiting.  No dark stools.  No syncope or pre-syncope.  Has had two colonoscopies in the last 5-7 yrs, reports both normal.  Bleeding has spontaneously resolved.  (3) Would like to d/c amlodipine.  Feels like she no longer needs this medication.  Currently supposed to take Maxide and amlodipine daily.  Has been doing every other day.  Did not take meds today - pressure 124/82 at triage.  Pt feels motivated to lose weight and hopefully reduce BP.  Able to check BPs at home but has not done so regularly.   Identifies Dr. Patsy Lager as PCP   Review of Systems  Constitutional: Negative.   HENT: Positive for postnasal drip.   Respiratory: Negative.   Cardiovascular: Negative.   Gastrointestinal: Positive for constipation and anal bleeding. Negative for nausea, vomiting, abdominal pain, diarrhea, blood in stool and rectal pain.  Genitourinary: Negative for  dysuria, vaginal bleeding, vaginal discharge, menstrual problem and pelvic pain.  Musculoskeletal: Negative.   Skin: Negative.   Neurological: Negative.        Objective:   Physical Exam  Vitals reviewed. Constitutional: She is oriented to person, place, and time. She appears well-developed and well-nourished. No distress.  HENT:  Head: Normocephalic and atraumatic.  Eyes: Conjunctivae are normal. No scleral icterus.  Cardiovascular: Normal rate, regular rhythm and normal heart sounds.   Pulmonary/Chest: Effort normal and breath sounds normal. She has no wheezes. She has no rales.  Abdominal: Soft. Bowel sounds are normal. She exhibits no distension and no mass. There is no tenderness. There is no rebound and no guarding.  Genitourinary: Rectal exam shows external hemorrhoid and internal hemorrhoid. Rectal exam shows no fissure, no mass, no tenderness and anal tone normal. There is no rash, tenderness or lesion on the right labia. There is no rash, tenderness or lesion on the left labia. Cervix exhibits no motion tenderness, no discharge and no friability. Right adnexum displays no mass, no tenderness and no fullness. Left adnexum displays no mass and no tenderness. Vaginal discharge (thin, white) found.  Neurological: She is alert and oriented to person, place, and time.  Skin: Skin is warm and dry.  Psychiatric: She has a normal mood and affect. Her behavior is normal.  Results for orders placed in visit on 01/10/13  IFOBT (OCCULT BLOOD)      Result Value Range   IFOBT Negative          Assessment & Plan:  Screening for cervical cancer - Plan: Pap IG, CT/NG w/ reflex HPV when ASC-U  Rectal bleeding - Plan: IFOBT POC (occult bld, rslt in office), docusate sodium (COLACE) 50 MG capsule  Hemorrhoids - Plan: docusate sodium (COLACE) 50 MG capsule  Unspecified constipation - Plan: docusate sodium (COLACE) 50 MG capsule   (1)  52 yr old female presents today for pap test.   Pap collected today.  Will follow up on results and refer if necessary.  (2)  S/p 5 days of rectal bleeding with bowel movements that has spontaneously resolved.  Pt with long history of hemorrhoids.  On exam, both external and internal hemorrhoids noted.  There is no evidence of thrombosis.  Pt does not have any discomfort.  Suspect bleeding is secondary to hemorrhoids and recent constipation.  Pt reports 2 negative colonoscopies in the last 5-7 yrs.  FOBT negative in clinic today.  No other alarm symptoms.  Will start colace BID to relieve constipation.  If brbpr recurs may send to GI to rule out pathology higher up in colon.  (3)  Pt's BP WNL today at 124/82.  Pt reports she did not take meds today.  On review of chart, pt's BP has been markedly elevated in the past.  Encouraged her to continue Maxide daily.  We can hold the amlodipine for 1-2 wks, but she must check home BPs 2-3 times per week.  If BPs >140/90 will need to resume amlodipine or make other med changes to bring BP under control.  Pt understands and is in agreement with this plan.     Addendum 01/11/13: Pap returned normal but with evidence of BV, will treat with Flagyl x 7 days

## 2013-01-11 LAB — PAP IG, CT-NG, RFX HPV ASCU
Chlamydia Probe Amp: NEGATIVE
GC Probe Amp: NEGATIVE

## 2013-01-11 MED ORDER — METRONIDAZOLE 500 MG PO TABS: 500.0000 mg | ORAL_TABLET | Freq: Two times a day (BID) | ORAL | Status: DC

## 2013-01-11 NOTE — Addendum Note (Signed)
Addended by: Godfrey Pick on: 01/11/2013 08:15 PM   Modules accepted: Orders

## 2013-01-18 ENCOUNTER — Telehealth: Payer: Self-pay

## 2013-01-18 NOTE — Telephone Encounter (Signed)
Patient returned call to [facility] TL/LABS - please call again.   [phone]

## 2013-01-19 NOTE — Telephone Encounter (Signed)
See lab results.  

## 2013-02-21 ENCOUNTER — Other Ambulatory Visit: Payer: Self-pay | Admitting: Physician Assistant

## 2013-02-21 ENCOUNTER — Telehealth: Payer: Self-pay

## 2013-02-21 NOTE — Telephone Encounter (Signed)
Was seen for problems from BV please advise about follow up.

## 2013-02-21 NOTE — Telephone Encounter (Signed)
She was seen for an acute problem and needs an OV to discuss HTN and have labs drawn. If she will make an appt or let us know when she plans to come in we can refill until that time.

## 2013-02-21 NOTE — Telephone Encounter (Signed)
Med encounter

## 2013-02-21 NOTE — Telephone Encounter (Signed)
PT STATES SHE WAS ONLY GIVEN 15 OF HER BP PILLS AND WAS TOLD BY THE PHARMACY SHE NEED AN OV FOR MORE. HOWEVER SHE WAS JUST SEEN 2 WEEKS AGO AND DOESN'T UNDERSTAND WHY PLEASE CALL [phone]    [facility] [address]

## 2013-02-22 NOTE — Telephone Encounter (Signed)
Called her to advise. She is advised she needs follow up office visit and labs. She advised she had these when she fell and hit her head. I advised her she did not and we need to see her.

## 2013-03-23 ENCOUNTER — Other Ambulatory Visit: Payer: Self-pay | Admitting: Physician Assistant

## 2013-03-27 ENCOUNTER — Ambulatory Visit (INDEPENDENT_AMBULATORY_CARE_PROVIDER_SITE_OTHER): Payer: BC Managed Care – PPO | Admitting: Internal Medicine

## 2013-03-27 VITALS — BP 150/88 | HR 80 | Temp 98.3°F | Resp 16 | Ht 63.7 in | Wt 194.2 lb

## 2013-03-27 DIAGNOSIS — Z79899 Other long term (current) drug therapy: Secondary | ICD-10-CM

## 2013-03-27 DIAGNOSIS — I10 Essential (primary) hypertension: Secondary | ICD-10-CM

## 2013-03-27 LAB — BASIC METABOLIC PANEL
BUN: 12 mg/dL (ref 6–23)
CO2: 26 mEq/L (ref 19–32)
Chloride: 104 mEq/L (ref 96–112)
Potassium: 3.8 mEq/L (ref 3.5–5.3)

## 2013-03-27 MED ORDER — TRIAMTERENE-HCTZ 37.5-25 MG PO TABS: 1.0000 | ORAL_TABLET | Freq: Every day | ORAL | Status: DC

## 2013-03-27 NOTE — Patient Instructions (Signed)
Hypertension  As your heart beats, it forces blood through your arteries. This force is your blood pressure. If the pressure is too high, it is called hypertension (HTN) or high blood pressure. HTN is dangerous because you may have it and not know it. High blood pressure may mean that your heart has to work harder to pump blood. Your arteries may be narrow or stiff. The extra work puts you at risk for heart disease, stroke, and other problems.   Blood pressure consists of two numbers, a higher number over a lower, 110/72, for example. It is stated as "110 over 72." The ideal is below 120 for the top number (systolic) and under 80 for the bottom (diastolic). Write down your blood pressure today.  You should pay close attention to your blood pressure if you have certain conditions such as:   Heart failure.   Prior heart attack.   Diabetes   Chronic kidney disease.   Prior stroke.   Multiple risk factors for heart disease.  To see if you have HTN, your blood pressure should be measured while you are seated with your arm held at the level of the heart. It should be measured at least twice. A one-time elevated blood pressure reading (especially in the Emergency Department) does not mean that you need treatment. There may be conditions in which the blood pressure is different between your right and left arms. It is important to see your caregiver soon for a recheck.  Most people have essential hypertension which means that there is not a specific cause. This type of high blood pressure may be lowered by changing lifestyle factors such as:   Stress.   Smoking.   Lack of exercise.   Excessive weight.   Drug/tobacco/alcohol use.   Eating less salt.  Most people do not have symptoms from high blood pressure until it has caused damage to the body. Effective treatment can often prevent, delay or reduce that damage.  TREATMENT    When a cause has been identified, treatment for high blood pressure is directed at the cause. There are a large number of medications to treat HTN. These fall into several categories, and your caregiver will help you select the medicines that are best for you. Medications may have side effects. You should review side effects with your caregiver.  If your blood pressure stays high after you have made lifestyle changes or started on medicines,    Your medication(s) may need to be changed.   Other problems may need to be addressed.   Be certain you understand your prescriptions, and know how and when to take your medicine.   Be sure to follow up with your caregiver within the time frame advised (usually within two weeks) to have your blood pressure rechecked and to review your medications.   If you are taking more than one medicine to lower your blood pressure, make sure you know how and at what times they should be taken. Taking two medicines at the same time can result in blood pressure that is too low.  SEEK IMMEDIATE MEDICAL CARE IF:   You develop a severe headache, blurred or changing vision, or confusion.   You have unusual weakness or numbness, or a faint feeling.   You have severe chest or abdominal pain, vomiting, or breathing problems.  MAKE SURE YOU:    Understand these instructions.   Will watch your condition.   Will get help right away if you are not doing well   or get worse.  Document Released: 07/05/2005 Document Revised: 09/27/2011 Document Reviewed: 02/23/2008  ExitCare Patient Information 2014 [employer]  DASH Diet   The DASH diet stands for "Dietary Approaches to Stop Hypertension." It is a healthy eating plan that has been shown to reduce high blood pressure (hypertension) in as little as 14 days, while also possibly providing other significant health benefits. These other health benefits include reducing the risk of breast cancer after menopause and reducing the risk of type 2 diabetes, heart disease, colon cancer, and stroke. Health benefits also include weight loss and slowing kidney failure in patients with chronic kidney disease.   DIET GUIDELINES   Limit salt (sodium). Your diet should contain less than 1500 mg of sodium daily.   Limit refined or processed carbohydrates. Your diet should include mostly whole grains. Desserts and added sugars should be used sparingly.   Include small amounts of heart-healthy fats. These types of fats include nuts, oils, and tub margarine. Limit saturated and trans fats. These fats have been shown to be harmful in the body.  CHOOSING FOODS   The following food groups are based on a 2000 calorie diet. See your Registered Dietitian for individual calorie needs.  Grains and Grain Products (6 to 8 servings daily)   Eat More Often: Whole-wheat bread, brown rice, whole-grain or wheat pasta, quinoa, popcorn without added fat or salt (air popped).   Eat Less Often: White bread, white pasta, white rice, cornbread.  Vegetables (4 to 5 servings daily)   Eat More Often: Fresh, frozen, and canned vegetables. Vegetables may be raw, steamed, roasted, or grilled with a minimal amount of fat.   Eat Less Often/Avoid: Creamed or fried vegetables. Vegetables in a cheese sauce.  Fruit (4 to 5 servings daily)   Eat More Often: All fresh, canned (in natural juice), or frozen fruits. Dried fruits without added sugar. One hundred percent fruit juice ( cup [237 mL] daily).   Eat Less Often: Dried fruits with added sugar. Canned fruit in light or heavy syrup.   Lean Meats, Fish, and Poultry (2 servings or less daily. One serving is 3 to 4 oz [85-114 g]).   Eat More Often: Ninety percent or leaner ground beef, tenderloin, sirloin. Round cuts of beef, chicken breast, turkey breast. All fish. Grill, bake, or broil your meat. Nothing should be fried.   Eat Less Often/Avoid: Fatty cuts of meat, turkey, or chicken leg, thigh, or wing. Fried cuts of meat or fish.  Dairy (2 to 3 servings)   Eat More Often: Low-fat or fat-free milk, low-fat plain or light yogurt, reduced-fat or part-skim cheese.   Eat Less Often/Avoid: Milk (whole, 2%).Whole milk yogurt. Full-fat cheeses.  Nuts, Seeds, and Legumes (4 to 5 servings per week)   Eat More Often: All without added salt.   Eat Less Often/Avoid: Salted nuts and seeds, canned beans with added salt.  Fats and Sweets (limited)   Eat More Often: Vegetable oils, tub margarines without trans fats, sugar-free gelatin. Mayonnaise and salad dressings.   Eat Less Often/Avoid: Coconut oils, palm oils, butter, stick margarine, cream, half and half, cookies, candy, pie.  FOR MORE INFORMATION  The Dash Diet Eating Plan: www.dashdiet.org  Document Released: 06/24/2011 Document Revised: 09/27/2011 Document Reviewed: 06/24/2011  ExitCare Patient Information 2014 [employer].

## 2013-03-27 NOTE — Progress Notes (Signed)
  Subjective:    Patient ID: Tiffany Contreras, female    DOB: 1960/11/27, 52 y.o.   MRN: [id number]  HPI Needs BP med, can feel her BP too high, agrees to return later for cpe   Review of Systems allergy    Objective:   Physical Exam  Vitals reviewed. Constitutional: She is oriented to person, place, and time. She appears well-developed and well-nourished. No distress.  HENT:  Head: Normocephalic.  Eyes: EOM are normal.  Cardiovascular: Normal rate, regular rhythm, normal heart sounds and intact distal pulses.   Pulmonary/Chest: Effort normal and breath sounds normal.  Neurological: She is alert and oriented to person, place, and time. She exhibits normal muscle tone. Coordination normal.  Psychiatric: She has a normal mood and affect.     bmet      Assessment & Plan:  HTN/RF Maxide Needs 52yo CPE soon and agrees

## 2013-04-01 ENCOUNTER — Encounter: Payer: Self-pay | Admitting: Family Medicine

## 2013-04-02 ENCOUNTER — Telehealth: Payer: Self-pay

## 2013-04-02 NOTE — Telephone Encounter (Signed)
PT STATES SHE HAVE A LUMP IN HER BREAST AND HAVE AN APPT WITH [facility] TOMORROW, BUT THEY NEED TO KNOW WHAT TO DO FOR HER FIRST BEFORE SHE CAN BE SEEN, WOULD LIKE [place] TO SEND OVER SOMETHING TO TELL THEM. PLEASE CALL PT AT [phone] WHEN IT IS DONE AND SHE NEED IT DONE TODAY

## 2013-04-02 NOTE — Telephone Encounter (Signed)
what

## 2013-04-03 ENCOUNTER — Telehealth: Payer: Self-pay

## 2013-04-03 NOTE — Telephone Encounter (Signed)
Computer kicked me out. She needs order for Diagnostic Mammogram, this order has been sent by [facility]

## 2013-04-03 NOTE — Telephone Encounter (Signed)
Opened in error

## 2013-04-25 ENCOUNTER — Telehealth: Payer: Self-pay

## 2013-04-25 NOTE — Telephone Encounter (Signed)
No we can not prescribe antibiotics without office visit. Called her to advise. Left message.

## 2013-04-25 NOTE — Telephone Encounter (Signed)
Patient has started feeling sick and would like to know if provider can prescribe a zpak so she doesn't have to come back in since she's been here several times recently.

## 2013-06-11 ENCOUNTER — Encounter: Payer: Self-pay | Admitting: Family Medicine

## 2013-07-09 ENCOUNTER — Ambulatory Visit (INDEPENDENT_AMBULATORY_CARE_PROVIDER_SITE_OTHER): Payer: BC Managed Care – PPO | Admitting: Emergency Medicine

## 2013-07-09 VITALS — BP 146/92 | HR 115 | Temp 99.9°F | Resp 18 | Ht 62.25 in | Wt 190.8 lb

## 2013-07-09 DIAGNOSIS — J018 Other acute sinusitis: Secondary | ICD-10-CM

## 2013-07-09 DIAGNOSIS — J209 Acute bronchitis, unspecified: Secondary | ICD-10-CM

## 2013-07-09 MED ORDER — PROMETHAZINE-CODEINE 6.25-10 MG/5ML PO SYRP: 5.0000 mL | ORAL_SOLUTION | Freq: Four times a day (QID) | ORAL | Status: AC | PRN

## 2013-07-09 MED ORDER — LEVOFLOXACIN 500 MG PO TABS: 500.0000 mg | ORAL_TABLET | Freq: Every day | ORAL | Status: AC

## 2013-07-09 NOTE — Progress Notes (Signed)
[facility] [address] [place] [address] [phone]  Date:  07/09/2013   Name:  Tiffany Contreras   DOB:  1960/07/29   MRN:  [id number]  PCP:  Abbe Amsterdam, MD    Chief Complaint: Fever, Cough and Generalized Body Aches   History of Present Illness:  Tiffany Contreras is a 52 y.o. very pleasant female patient who presents with the following:  Ill since thanksgiving with intermittent symptoms of purulent nasal congestion and post nasal drip.  Has a sore throat.  Cough productive of dark purulent sputum.  No wheezing but short of breath with exertion.  Has low grade fever and no chills.  No nausea or vomiting,  No stool change.  No improvement with over the counter medications or other home remedies. Denies other complaint or health concern today. Has muscle aches and myalgias.    Patient Active Problem List   Diagnosis Date Noted  . HTN (hypertension) 06/17/2012    Past Medical History  Diagnosis Date  . Hypertension   . Allergy   . Asthma     Past Surgical History  Procedure Laterality Date  . Bladder repair w/ cesarean section    . Cesarean section      NO BLADDER REPAIR    History  Substance Use Topics  . Smoking status: Never Smoker   . Smokeless tobacco: Not on file  . Alcohol Use: Not on file    Family History  Problem Relation Age of Onset  . Hypertension Mother   . Diabetes Mother   . Hypertension Father     Allergies  Allergen Reactions  . Anaprox [Naproxen] Itching  . Penicillins     unknown    Medication list has been reviewed and updated.  Current Outpatient Prescriptions on File Prior to Visit  Medication Sig Dispense Refill  . amLODipine (NORVASC) 5 MG tablet Take 1 tablet (5 mg total) by mouth daily.  90 tablet  3  . EPINEPHrine (EPI-PEN) 0.3 mg/0.3 mL DEVI Inject 0.3 mLs (0.3 mg total) into the muscle once.  2 Device  2  . triamterene-hydrochlorothiazide (MAXZIDE-25) 37.5-25 MG per tablet Take 1  tablet by mouth daily.  90 tablet  3   No current facility-administered medications on file prior to visit.    Review of Systems:  As per HPI, otherwise negative.    Physical Examination: Filed Vitals:   07/09/13 1118  BP: 146/92  Pulse: 115  Temp: 99.9 F (37.7 C)  Resp: 18   Filed Vitals:   07/09/13 1118  Height: 5' 2.25" (1.581 m)  Weight: 190 lb 12.8 oz (86.546 kg)   Body mass index is 34.62 kg/(m^2). Ideal Body Weight: Weight in (lb) to have BMI = 25: 137.5  GEN: WDWN, NAD, Non-toxic, A & O x 3 HEENT: Atraumatic, Normocephalic. Neck supple. No masses, No LAD. Ears and Nose: No external deformity. CV: RRR, No M/G/R. No JVD. No thrill. No extra heart sounds. PULM: CTA B, no wheezes, crackles, rhonchi. No retractions. No resp. distress. No accessory muscle use. ABD: S, NT, ND, +BS. No rebound. No HSM. EXTR: No c/c/e NEURO Normal gait.  PSYCH: Normally interactive. Conversant. Not depressed or anxious appearing.  Calm demeanor.    Assessment and Plan: Sinusitis Bronchitis levaquin (pcn allergy) Phen c cod mucinex d  Signed,  Phillips Odor, MD

## 2013-07-09 NOTE — Patient Instructions (Signed)

## 2013-07-14 ENCOUNTER — Telehealth: Payer: Self-pay

## 2013-07-14 NOTE — Telephone Encounter (Signed)
Pt called states second call requesting diflucan. Was seen last week and prescribed and antibiotic and now has a yeast infection. [facility] [address] Please call pt to advise

## 2013-07-14 NOTE — Telephone Encounter (Signed)
Pt was seen last week for bronchitis.  The antibiotic she was given has caused her to get a yeast infection.  Can we please call her in something for this? [phone]

## 2013-07-15 ENCOUNTER — Other Ambulatory Visit: Payer: Self-pay | Admitting: Family Medicine

## 2013-07-15 DIAGNOSIS — B373 Candidiasis of vulva and vagina: Secondary | ICD-10-CM

## 2013-07-15 MED ORDER — FLUCONAZOLE 150 MG PO TABS
150.0000 mg | ORAL_TABLET | Freq: Once | ORAL | Status: AC
Start: 2013-07-15 — End: ?

## 2013-07-15 NOTE — Telephone Encounter (Signed)
Per Dr. Dareen Piano ok to send to pharm diflucan 150 mg dispense 2 tabs. Sig: take 1 tablet now po, take 1 tablet po in one week. Sent to pharm, spoke to pt, she is aware.

## 2013-09-03 ENCOUNTER — Other Ambulatory Visit: Payer: Self-pay | Admitting: Family Medicine

## 2013-10-22 ENCOUNTER — Encounter: Payer: Self-pay | Admitting: Family Medicine

## 2014-01-02 ENCOUNTER — Other Ambulatory Visit: Payer: Self-pay | Admitting: Physician Assistant

## 2014-03-20 ENCOUNTER — Telehealth: Payer: Self-pay

## 2014-03-20 MED ORDER — AMLODIPINE BESYLATE 5 MG PO TABS: 5.0000 mg | ORAL_TABLET | Freq: Every day | ORAL | Status: DC

## 2014-03-20 NOTE — Telephone Encounter (Signed)
Pt called in and states that [facility] two weeks. She is now out. [facility] [address] She can be reached @ [phone]. Thank you

## 2014-03-20 NOTE — Telephone Encounter (Signed)
LM for pt- has not had refill since June 2015 needs OV for further refills.

## 2014-03-20 NOTE — Telephone Encounter (Signed)
Spoke with pt. She says that she has only been taking 1/2 tab qd "trying to wean herself off because she's not taking BP meds the rest of her life". Advised her that she really needs to RTC, especially if she's not taking the dose as directed, and that her last HTN visit was 03/27/13. Told pt I would give her 2 weeks worth to give her time to RTC.

## 2014-04-17 ENCOUNTER — Telehealth: Payer: Self-pay

## 2014-04-17 NOTE — Telephone Encounter (Signed)
PATIENT NEEDS TO GET REFILLS ON HER BLOOD PRESSURE MEDICATION. WE TOLD HER SHE WOULD NEED TO COME INTO THE OFFICE BECAUSE SHE HAS NOT BEEN SEEN IN A YEAR. SHE WANTS TO KNOW IF SHE HAS TO SEE A DOCTOR OR CAN SHE JUST COME IN TO HAVE HER BLOOD WORK DONE? IF SHE HAS TO SEE A DOCTOR, SHE WANTS TO SEE DR. COPLAND. BEST PHONE [phone] (CELL)  [facility]

## 2014-04-17 NOTE — Telephone Encounter (Signed)
Spoke to pt- gave her Dr. Cyndie Chimeopland's walk in schedule for the next week. Pt states she is going to try to come in on Friday.

## 2014-04-19 ENCOUNTER — Ambulatory Visit (INDEPENDENT_AMBULATORY_CARE_PROVIDER_SITE_OTHER): Payer: BC Managed Care – PPO | Admitting: Family Medicine

## 2014-04-19 VITALS — BP 132/90 | HR 90 | Wt 193.0 lb

## 2014-04-19 DIAGNOSIS — E669 Obesity, unspecified: Secondary | ICD-10-CM

## 2014-04-19 DIAGNOSIS — I10 Essential (primary) hypertension: Secondary | ICD-10-CM

## 2014-04-19 DIAGNOSIS — R635 Abnormal weight gain: Secondary | ICD-10-CM

## 2014-04-19 DIAGNOSIS — Z79899 Other long term (current) drug therapy: Secondary | ICD-10-CM

## 2014-04-19 LAB — TSH: TSH: 1.589 u[IU]/mL (ref 0.350–4.500)

## 2014-04-19 MED ORDER — AMLODIPINE BESYLATE 5 MG PO TABS: 5.0000 mg | ORAL_TABLET | Freq: Every day | ORAL | Status: DC

## 2014-04-19 MED ORDER — TRIAMTERENE-HCTZ 37.5-25 MG PO TABS: 1.0000 | ORAL_TABLET | Freq: Every day | ORAL | Status: DC

## 2014-04-19 NOTE — Patient Instructions (Addendum)
Good to see you today! I will be in touch with your labs.  Your BP looks reasonable even off medications today; you might try continuing to hold the amlodipine if your BP remains under 135/80 with just the maxzide.    I will check your thyroid to make sure there is nothing causing you to gain weight.

## 2014-04-19 NOTE — Progress Notes (Addendum)
[facility] [address], [place] [id number] [phone]  Date:  04/19/2014   Name:  Tiffany Contreras   DOB:  Jan 24, 1961   MRN:  [id number]  PCP:  Abbe AmsterdamOPLAND,JESSICA, MD    Chief Complaint: lab work   History of Present Illness:  Tiffany Contreras is a 53 y.o. very pleasant female patient who presents with the following:  She ran out of her BP meds a couple of days ago. Prior to that she was running 122/85-90 at home BP checks.  She sometimes will skip her medications.   There is a family history of HTN She is fasting today Just had her mammogram today Declines flu shot She has been exercising, and trying to eat better but seems to be gaining weight  Wt Readings from Last 3 Encounters:  04/19/14 193 lb (87.544 kg)  07/09/13 190 lb 12.8 oz (86.546 kg)  03/27/13 194 lb 3.2 oz (88.089 kg)      Patient Active Problem List   Diagnosis Date Noted  . HTN (hypertension) 06/17/2012    Past Medical History  Diagnosis Date  . Hypertension   . Allergy   . Asthma     Past Surgical History  Procedure Laterality Date  . Bladder repair w/ cesarean section    . Cesarean section      NO BLADDER REPAIR    History  Substance Use Topics  . Smoking status: Never Smoker   . Smokeless tobacco: Not on file  . Alcohol Use: Not on file    Family History  Problem Relation Age of Onset  . Hypertension Mother   . Diabetes Mother   . Hypertension Father     Allergies  Allergen Reactions  . Anaprox [Naproxen] Itching  . Penicillins     unknown    Medication list has been reviewed and updated.  Current Outpatient Prescriptions on File Prior to Visit  Medication Sig Dispense Refill  . amLODipine (NORVASC) 5 MG tablet Take 1 tablet (5 mg total) by mouth daily. PATIENT NEEDS OFFICE VISIT FOR ADDITIONAL REFILLS  14 tablet  0  . EPINEPHrine (EPI-PEN) 0.3 mg/0.3 mL DEVI Inject 0.3 mLs (0.3 mg total) into the muscle once.  2 Device  2  .  triamterene-hydrochlorothiazide (MAXZIDE-25) 37.5-25 MG per tablet Take 1 tablet by mouth daily.  90 tablet  3  . fluconazole (DIFLUCAN) 150 MG tablet Take 1 tablet (150 mg total) by mouth once. Take the second tablet by mouth one week later.  2 tablet  0  . promethazine-codeine (PHENERGAN WITH CODEINE) 6.25-10 MG/5ML syrup Take 5-10 mLs by mouth every 6 (six) hours as needed for cough.  120 mL  0   No current facility-administered medications on file prior to visit.    Review of Systems:  As per HPI- otherwise negative.   Physical Examination: There were no vitals filed for this visit. There were no vitals filed for this visit. There is no weight on file to calculate BMI. Ideal Body Weight:    GEN: WDWN, NAD, Non-toxic, A & O x 3, obese, looks well HEENT: Atraumatic, Normocephalic. Neck supple. No masses, No LAD. Ears and Nose: No external deformity. CV: RRR, No M/G/R. No JVD. No thrill. No extra heart sounds. PULM: CTA B, no wheezes, crackles, rhonchi. No retractions. No resp. distress. No accessory muscle use. EXTR: No c/c/e NEURO Normal gait.  PSYCH: Normally interactive. Conversant. Not depressed or anxious appearing.  Calm demeanor.    Assessment  and Plan: Essential hypertension - Plan: Comprehensive metabolic panel, triamterene-hydrochlorothiazide (MAXZIDE-25) 37.5-25 MG per tablet, amLODipine (NORVASC) 5 MG tablet  Encounter for medication review - Plan: triamterene-hydrochlorothiazide (MAXZIDE-25) 37.5-25 MG per tablet  Weight gain - Plan: TSH, Lipid panel  Will check labs as above.  Her BP is acceptable even off meds totally for 2 days.  She will try taking just the maxzide and see how she does Will plan further follow- up pending labs.   Signed Abbe Amsterdam, MD  Called 10/3 with labs- she has developed DM, her FBG is 128.  She is not happy but understands.  Discussed metformin but she would prefer to try management with diet and lifestyle for now.  Encouraged  her to look at the ADA website and asked her to come and see me for a recheck in 1-2 months

## 2014-04-20 ENCOUNTER — Encounter: Payer: Self-pay | Admitting: Family Medicine

## 2014-04-20 DIAGNOSIS — E119 Type 2 diabetes mellitus without complications: Secondary | ICD-10-CM | POA: Insufficient documentation

## 2014-04-20 DIAGNOSIS — E669 Obesity, unspecified: Secondary | ICD-10-CM | POA: Insufficient documentation

## 2014-04-20 LAB — LIPID PANEL
CHOL/HDL RATIO: 3.5 ratio
Cholesterol: 155 mg/dL (ref 0–200)
HDL: 44 mg/dL (ref >39)
LDL CALC: 99 mg/dL (ref 0–99)
Triglycerides: 59 mg/dL (ref <150)
VLDL: 12 mg/dL (ref 0–40)

## 2014-04-20 LAB — COMPREHENSIVE METABOLIC PANEL
ALK PHOS: 88 U/L (ref 39–117)
ALT: 16 U/L (ref 0–35)
AST: 19 U/L (ref 0–37)
Albumin: 4 g/dL (ref 3.5–5.2)
BILIRUBIN TOTAL: 0.5 mg/dL (ref 0.2–1.2)
BUN: 11 mg/dL (ref 6–23)
CO2: 26 meq/L (ref 19–32)
Calcium: 9.1 mg/dL (ref 8.4–10.5)
Chloride: 104 mEq/L (ref 96–112)
Creat: 0.83 mg/dL (ref 0.50–1.10)
Glucose, Bld: 128 mg/dL — ABNORMAL HIGH (ref 70–99)
Potassium: 4 mEq/L (ref 3.5–5.3)
SODIUM: 138 meq/L (ref 135–145)
TOTAL PROTEIN: 6.5 g/dL (ref 6.0–8.3)

## 2014-05-07 ENCOUNTER — Encounter: Payer: Self-pay | Admitting: Family Medicine

## 2014-05-14 ENCOUNTER — Other Ambulatory Visit: Payer: Self-pay | Admitting: Emergency Medicine

## 2014-05-14 DIAGNOSIS — Z87892 Personal history of anaphylaxis: Secondary | ICD-10-CM

## 2014-05-14 DIAGNOSIS — J302 Other seasonal allergic rhinitis: Secondary | ICD-10-CM

## 2014-05-14 MED ORDER — ALBUTEROL SULFATE HFA 108 (90 BASE) MCG/ACT IN AERS: 2.0000 | INHALATION_SPRAY | RESPIRATORY_TRACT | Status: DC | PRN

## 2014-05-14 NOTE — Telephone Encounter (Signed)
Patient is requesting albuterol for her seasonal allergies Previously obtained from [facility] - No longer a patient there.  [facility]   [phone]

## 2014-05-14 NOTE — Telephone Encounter (Signed)
Called to check on her- she has just occasional wheezing and had an anaphylactic reaction to some sort of cleaning product in the past.

## 2014-05-14 NOTE — Telephone Encounter (Signed)
Dr Patsy Lageropland, please review note from pt requesting new Rx for albuterol, and can we RF her epi pen?

## 2015-02-25 ENCOUNTER — Encounter: Payer: Self-pay | Admitting: *Deleted

## 2015-04-12 ENCOUNTER — Ambulatory Visit (INDEPENDENT_AMBULATORY_CARE_PROVIDER_SITE_OTHER): Payer: BLUE CROSS/BLUE SHIELD | Admitting: Emergency Medicine

## 2015-04-12 VITALS — BP 132/80 | HR 100 | Temp 98.2°F | Resp 18 | Ht 62.25 in | Wt 193.5 lb

## 2015-04-12 DIAGNOSIS — E669 Obesity, unspecified: Secondary | ICD-10-CM | POA: Diagnosis not present

## 2015-04-12 DIAGNOSIS — I10 Essential (primary) hypertension: Secondary | ICD-10-CM | POA: Diagnosis not present

## 2015-04-12 DIAGNOSIS — J302 Other seasonal allergic rhinitis: Secondary | ICD-10-CM | POA: Diagnosis not present

## 2015-04-12 LAB — POCT URINALYSIS DIP (MANUAL ENTRY)
BILIRUBIN UA: NEGATIVE
Blood, UA: NEGATIVE
GLUCOSE UA: NEGATIVE
Ketones, POC UA: NEGATIVE
Leukocytes, UA: NEGATIVE
Nitrite, UA: NEGATIVE
Protein Ur, POC: NEGATIVE
SPEC GRAV UA: 1.015
UROBILINOGEN UA: 0.2
pH, UA: 6.5

## 2015-04-12 LAB — POC MICROSCOPIC URINALYSIS (UMFC)

## 2015-04-12 LAB — HEMOGLOBIN A1C: Hgb A1c MFr Bld: 6.1 % — AB (ref 4.0–6.0)

## 2015-04-12 LAB — POCT GLYCOSYLATED HEMOGLOBIN (HGB A1C): HEMOGLOBIN A1C: 6.1

## 2015-04-12 LAB — GLUCOSE, POCT (MANUAL RESULT ENTRY): POC Glucose: 94 mg/dl (ref 70–99)

## 2015-04-12 MED ORDER — FLUTICASONE PROPIONATE 50 MCG/ACT NA SUSP: 2.0000 | Freq: Every day | NASAL | Status: AC

## 2015-04-12 MED ORDER — POLYMYXIN B-TRIMETHOPRIM 10000-0.1 UNIT/ML-% OP SOLN: 2.0000 [drp] | OPHTHALMIC | Status: AC

## 2015-04-12 MED ORDER — ALBUTEROL SULFATE HFA 108 (90 BASE) MCG/ACT IN AERS: 2.0000 | INHALATION_SPRAY | RESPIRATORY_TRACT | Status: AC | PRN

## 2015-04-12 NOTE — Progress Notes (Signed)
Subjective:  Patient ID: Tiffany Contreras, female    DOB: Jul 05, 1961  Age: 54 y.o. MRN: [id number]  CC: Stye and Medication Refill   HPI Tiffany Contreras presents  with a concern about a stye on her left upper lid that she was treated by her eye doctor with an ointment. This was over a month ago and she still has symptoms. She's been using a warm compress with no improvement. She has no visual symptoms. She is also concerned about getting lab work as she was told that she was "borderline diabetic" she also has high blood pressure in his unhappy with having to take medication and is actively trying to change her risk factors so she doesn't have to take the blood pressure medicine anymore  History Tiffany Contreras has a past medical history of Hypertension; Allergy; and Asthma.   She has past surgical history that includes Bladder repair w/ Cesarean section and Cesarean section.   Her  family history includes Diabetes in her mother; Hypertension in her father and mother.  She   reports that she has never smoked. She does not have any smokeless tobacco history on file. Her alcohol and drug histories are not on file.  Outpatient Prescriptions Prior to Visit  Medication Sig Dispense Refill  . amLODipine (NORVASC) 5 MG tablet Take 1 tablet (5 mg total) by mouth daily. 90 tablet 3  . EPIPEN 2-PAK 0.3 MG/0.3ML SOAJ injection INJECT 0.3 MLS (0.3 MG TOTAL) INTO THE MUSCLE ONCE. 2 Device 2  . triamterene-hydrochlorothiazide (MAXZIDE-25) 37.5-25 MG per tablet Take 1 tablet by mouth daily. 90 tablet 3  . fluconazole (DIFLUCAN) 150 MG tablet Take 1 tablet (150 mg total) by mouth once. Take the second tablet by mouth one week later. (Patient not taking: Reported on 04/12/2015) 2 tablet 0  . promethazine-codeine (PHENERGAN WITH CODEINE) 6.25-10 MG/5ML syrup Take 5-10 mLs by mouth every 6 (six) hours as needed for cough. (Patient not taking: Reported on 04/12/2015) 120 mL 0  . albuterol (PROVENTIL  HFA;VENTOLIN HFA) 108 (90 BASE) MCG/ACT inhaler Inhale 2 puffs into the lungs every 4 (four) hours as needed (cough, shortness of breath or wheezing.). (Patient not taking: Reported on 04/12/2015) 1 Inhaler 5   No facility-administered medications prior to visit.    Social History   Social History  . Marital Status: Married    Spouse Name: N/A  . Number of Children: N/A  . Years of Education: N/A   Social History Main Topics  . Smoking status: Never Smoker   . Smokeless tobacco: None  . Alcohol Use: None  . Drug Use: None  . Sexual Activity: Not Asked   Other Topics Concern  . None   Social History Narrative     Review of Systems  Constitutional: Negative for fever, chills and appetite change.  HENT: Negative for congestion, ear pain, postnasal drip, sinus pressure and sore throat.   Eyes: Negative for pain and redness.  Respiratory: Negative for cough, shortness of breath and wheezing.   Cardiovascular: Negative for leg swelling.  Gastrointestinal: Negative for nausea, vomiting, abdominal pain, diarrhea, constipation and blood in stool.  Endocrine: Negative for polyuria.  Genitourinary: Negative for dysuria, urgency, frequency and flank pain.  Musculoskeletal: Negative for gait problem.  Skin: Negative for rash.  Neurological: Negative for weakness and headaches.  Psychiatric/Behavioral: Negative for confusion and decreased concentration. The patient is not nervous/anxious.     Objective:  BP 132/80 mmHg  Pulse 100  Temp(Src) 98.2 F (36.8 C) (  Oral)  Resp 18  Ht 5' 2.25" (1.581 m)  Wt 193 lb 8 oz (87.771 kg)  BMI 35.11 kg/m2  SpO2 98%  Physical Exam  Constitutional: She is oriented to person, place, and time. She appears well-developed and well-nourished. No distress.  HENT:  Head: Normocephalic and atraumatic.  Right Ear: External ear normal.  Left Ear: External ear normal.  Nose: Nose normal.  Eyes: Conjunctivae and EOM are normal. Pupils are equal,  round, and reactive to light. Left eye exhibits hordeolum. No scleral icterus.  Neck: Normal range of motion. Neck supple. No tracheal deviation present.  Cardiovascular: Normal rate, regular rhythm and normal heart sounds.   Pulmonary/Chest: Effort normal. No respiratory distress. She has no wheezes. She has no rales.  Abdominal: She exhibits no mass. There is no tenderness. There is no rebound and no guarding.  Musculoskeletal: She exhibits no edema.  Lymphadenopathy:    She has no cervical adenopathy.  Neurological: She is alert and oriented to person, place, and time. Coordination normal.  Skin: Skin is warm and dry. No rash noted.  Psychiatric: She has a normal mood and affect. Her behavior is normal.      Assessment & Plan:   Tiffany Contreras was seen today for stye and medication refill.  Diagnoses and all orders for this visit:  Essential hypertension -     CBC -     POCT glycosylated hemoglobin (Hb A1C) -     POCT glucose (manual entry) -     Comprehensive metabolic panel -     Lipid panel -     POCT urinalysis dipstick -     POCT Microscopic Urinalysis ([facility])  Obesity -     CBC -     POCT glycosylated hemoglobin (Hb A1C) -     POCT glucose (manual entry) -     Comprehensive metabolic panel -     Lipid panel -     POCT urinalysis dipstick -     POCT Microscopic Urinalysis ([facility])  Seasonal allergies -     albuterol (PROVENTIL HFA;VENTOLIN HFA) 108 (90 BASE) MCG/ACT inhaler; Inhale 2 puffs into the lungs every 4 (four) hours as needed (cough, shortness of breath or wheezing.).  Other orders -     trimethoprim-polymyxin b (POLYTRIM) ophthalmic solution; Place 2 drops into the left eye every 4 (four) hours. -     fluticasone (FLONASE) 50 MCG/ACT nasal spray; Place 2 sprays into both nostrils daily.   I am having Tiffany Contreras start on trimethoprim-polymyxin b and fluticasone. I am also having her maintain her promethazine-codeine, fluconazole,  triamterene-hydrochlorothiazide, amLODipine, EPIPEN 2-PAK, and albuterol.  Meds ordered this encounter  Medications  . trimethoprim-polymyxin b (POLYTRIM) ophthalmic solution    Sig: Place 2 drops into the left eye every 4 (four) hours.    Dispense:  10 mL    Refill:  0  . albuterol (PROVENTIL HFA;VENTOLIN HFA) 108 (90 BASE) MCG/ACT inhaler    Sig: Inhale 2 puffs into the lungs every 4 (four) hours as needed (cough, shortness of breath or wheezing.).    Dispense:  1 Inhaler    Refill:  12  . fluticasone (FLONASE) 50 MCG/ACT nasal spray    Sig: Place 2 sprays into both nostrils daily.    Dispense:  16 g    Refill:  12   He was advised to follow up with her eye doctor this week about stye. Her lab work was reviewed and she does not have diabetes based on  the lab work that we see  Appropriate red flag conditions were discussed with the patient as well as actions that should be taken.  Patient expressed his understanding.  Follow-up: Return if symptoms worsen or fail to improve.  Carmelina Dane, MD

## 2015-04-12 NOTE — Patient Instructions (Signed)

## 2015-04-13 LAB — COMPREHENSIVE METABOLIC PANEL
ALT: 18 U/L (ref 6–29)
AST: 23 U/L (ref 10–35)
Albumin: 3.7 g/dL (ref 3.6–5.1)
Alkaline Phosphatase: 90 U/L (ref 33–130)
BUN: 8 mg/dL (ref 7–25)
CALCIUM: 9 mg/dL (ref 8.6–10.4)
CO2: 28 mmol/L (ref 20–31)
Chloride: 104 mmol/L (ref 98–110)
Creat: 0.74 mg/dL (ref 0.50–1.05)
GLUCOSE: 81 mg/dL (ref 65–99)
POTASSIUM: 4.2 mmol/L (ref 3.5–5.3)
Sodium: 137 mmol/L (ref 135–146)
Total Bilirubin: 0.5 mg/dL (ref 0.2–1.2)
Total Protein: 6.8 g/dL (ref 6.1–8.1)

## 2015-04-13 LAB — CBC
HCT: 34.6 % — ABNORMAL LOW (ref 36.0–46.0)
Hemoglobin: 11.2 g/dL — ABNORMAL LOW (ref 12.0–15.0)
MCH: 21.1 pg — AB (ref 26.0–34.0)
MCHC: 32.4 g/dL (ref 30.0–36.0)
MCV: 65 fL — AB (ref 78.0–100.0)
Platelets: 285 10*3/uL (ref 150–400)
RBC: 5.32 MIL/uL — ABNORMAL HIGH (ref 3.87–5.11)
RDW: 17 % — AB (ref 11.5–15.5)
WBC: 7 10*3/uL (ref 4.0–10.5)

## 2015-04-13 LAB — LIPID PANEL
CHOL/HDL RATIO: 3.5 ratio (ref <=5.0)
Cholesterol: 181 mg/dL (ref 125–200)
HDL: 51 mg/dL (ref >=46)
LDL Cholesterol: 116 mg/dL (ref <130)
Triglycerides: 70 mg/dL (ref <150)
VLDL: 14 mg/dL (ref <30)

## 2015-05-14 ENCOUNTER — Encounter: Payer: Self-pay | Admitting: Family Medicine

## 2015-07-05 LAB — HM MAMMOGRAPHY

## 2015-07-09 ENCOUNTER — Encounter: Payer: Self-pay | Admitting: Family Medicine

## 2015-08-08 ENCOUNTER — Encounter: Payer: Self-pay | Admitting: Family Medicine

## 2015-08-13 ENCOUNTER — Encounter: Payer: Self-pay | Admitting: Family Medicine

## 2015-09-18 LAB — HM MAMMOGRAPHY

## 2015-10-15 ENCOUNTER — Encounter: Payer: Self-pay | Admitting: Family Medicine

## 2015-10-28 ENCOUNTER — Encounter: Payer: Self-pay | Admitting: Family Medicine

## 2016-01-19 ENCOUNTER — Ambulatory Visit (INDEPENDENT_AMBULATORY_CARE_PROVIDER_SITE_OTHER): Payer: BLUE CROSS/BLUE SHIELD | Admitting: Family Medicine

## 2016-01-19 VITALS — BP 132/88 | HR 84 | Temp 98.1°F | Resp 18 | Ht 64.25 in | Wt 195.0 lb

## 2016-01-19 DIAGNOSIS — R7309 Other abnormal glucose: Secondary | ICD-10-CM

## 2016-01-19 DIAGNOSIS — R110 Nausea: Secondary | ICD-10-CM | POA: Diagnosis not present

## 2016-01-19 DIAGNOSIS — N951 Menopausal and female climacteric states: Secondary | ICD-10-CM | POA: Diagnosis not present

## 2016-01-19 DIAGNOSIS — J301 Allergic rhinitis due to pollen: Secondary | ICD-10-CM | POA: Diagnosis not present

## 2016-01-19 DIAGNOSIS — I10 Essential (primary) hypertension: Secondary | ICD-10-CM | POA: Diagnosis not present

## 2016-01-19 DIAGNOSIS — Z79899 Other long term (current) drug therapy: Secondary | ICD-10-CM

## 2016-01-19 LAB — POCT URINALYSIS DIP (MANUAL ENTRY)
BILIRUBIN UA: NEGATIVE
Blood, UA: NEGATIVE
GLUCOSE UA: NEGATIVE
Ketones, POC UA: NEGATIVE
LEUKOCYTES UA: NEGATIVE
NITRITE UA: NEGATIVE
Protein Ur, POC: NEGATIVE
Spec Grav, UA: >=1.03
UROBILINOGEN UA: 0.2
pH, UA: 5.5

## 2016-01-19 LAB — POCT URINE PREGNANCY: Preg Test, Ur: NEGATIVE

## 2016-01-19 LAB — CBC WITH DIFFERENTIAL/PLATELET
BASOS ABS: 0 {cells}/uL (ref 0–200)
BASOS PCT: 0 %
EOS ABS: 87 {cells}/uL (ref 15–500)
Eosinophils Relative: 1 %
HEMATOCRIT: 33.2 % — AB (ref 35.0–45.0)
HEMOGLOBIN: 10.1 g/dL — AB (ref 11.7–15.5)
LYMPHS ABS: 2958 {cells}/uL (ref 850–3900)
Lymphocytes Relative: 34 %
MCH: 18.6 pg — AB (ref 27.0–33.0)
MCHC: 30.4 g/dL — ABNORMAL LOW (ref 32.0–36.0)
MCV: 61.3 fL — AB (ref 80.0–100.0)
MONO ABS: 696 {cells}/uL (ref 200–950)
MONOS PCT: 8 %
NEUTROS ABS: 4959 {cells}/uL (ref 1500–7800)
Neutrophils Relative %: 57 %
Platelets: 302 10*3/uL (ref 140–400)
RBC: 5.42 MIL/uL — ABNORMAL HIGH (ref 3.80–5.10)
RDW: 19.3 % — ABNORMAL HIGH (ref 11.0–15.0)
WBC: 8.7 10*3/uL (ref 3.8–10.8)

## 2016-01-19 LAB — COMPREHENSIVE METABOLIC PANEL
ALBUMIN: 4.2 g/dL (ref 3.6–5.1)
ALT: 16 U/L (ref 6–29)
AST: 20 U/L (ref 10–35)
Alkaline Phosphatase: 95 U/L (ref 33–130)
BUN: 12 mg/dL (ref 7–25)
CALCIUM: 9.7 mg/dL (ref 8.6–10.4)
CHLORIDE: 105 mmol/L (ref 98–110)
CO2: 24 mmol/L (ref 20–31)
Creat: 0.78 mg/dL (ref 0.50–1.05)
Glucose, Bld: 97 mg/dL (ref 65–99)
POTASSIUM: 4.2 mmol/L (ref 3.5–5.3)
Sodium: 140 mmol/L (ref 135–146)
TOTAL PROTEIN: 7 g/dL (ref 6.1–8.1)
Total Bilirubin: 0.4 mg/dL (ref 0.2–1.2)

## 2016-01-19 LAB — POC MICROSCOPIC URINALYSIS (UMFC): Mucus: ABSENT

## 2016-01-19 LAB — HEMOGLOBIN A1C
Hgb A1c MFr Bld: 6.3 % — ABNORMAL HIGH (ref <5.7)
Mean Plasma Glucose: 134 mg/dL

## 2016-01-19 LAB — TSH: TSH: 1.66 mIU/L

## 2016-01-19 LAB — T4, FREE: FREE T4: 1.2 ng/dL (ref 0.8–1.8)

## 2016-01-19 MED ORDER — VENLAFAXINE HCL 37.5 MG PO TABS: 37.5000 mg | ORAL_TABLET | Freq: Two times a day (BID) | ORAL | Status: AC

## 2016-01-19 MED ORDER — OMEPRAZOLE 20 MG PO CPDR: 20.0000 mg | DELAYED_RELEASE_CAPSULE | Freq: Every day | ORAL | Status: AC

## 2016-01-19 MED ORDER — TRIAMTERENE-HCTZ 37.5-25 MG PO TABS: 1.0000 | ORAL_TABLET | Freq: Every day | ORAL | Status: AC

## 2016-01-19 NOTE — Progress Notes (Signed)
Subjective:    Patient ID: Tiffany Contreras, female    DOB: 08/26/1960, 55 y.o.   MRN: [id number]  01/19/2016  Menstrual Problem (C/O weight gain, morning sickness, irregular cycles, & heavy cycles x 3-4 weeks)   HPI This 55 y.o. female presents for evaluation of several symptoms.    HTN: taking Amlodipine qod and Maxzide qod.  Had a concussion and blood pressure remained elevated.  May skip a couple of days; will not skip too many days. Not checking BP at home.  Does have a BP cuff.  Some swelling intermittent.    2.  Perimenopausal: really thought was pregnant; missed May; had menses June which was really heavy and dark blood.  Had a menses in April. Skipping several months.  Duration 3-4 days; now 5-6 days.  Mother went through menopause age 859.  Pap smear 12/2012 WNL.  Hot flashes.  Previous Klonopin.  S/p gynecologist several years ago; discussed a minipill.  Sister is age 53 and having regular menses.  Last gynecology appointment ten years ago.    3.  Moodiness: gets agitated easily; may shout at husband a bit.  Onset in January with irregular menses.  Sleeping well.  Has started snoring horribly.  Gained all the weight back.  Works from home; runs a daycare.  Got a treadmill; losign 20-30 pounds; eating right.  Eating really well.  Not eating meat; eating small amounts of portions daily. Home pregnancy test negative.  Quit exercising two weeks ago.  4.  Nauseated in the mornings.   Worried about reflux.  No vomiting.  No diarrhea.  No heart burn or indigestion. S/p several colonoscopies in the past.  5. Allergic Rhintiis: no medication for PND.  No Flonase.    Review of Systems  Constitutional: Negative for fever, chills, diaphoresis and fatigue.  HENT: Positive for congestion, postnasal drip and rhinorrhea. Negative for ear pain, sore throat, trouble swallowing and voice change.   Eyes: Negative for visual disturbance.  Respiratory: Negative for cough and shortness of breath.      +snores  Cardiovascular: Negative for chest pain, palpitations and leg swelling.  Gastrointestinal: Positive for nausea. Negative for vomiting, abdominal pain, diarrhea, constipation, blood in stool, abdominal distention and anal bleeding.  Endocrine: Negative for cold intolerance, heat intolerance, polydipsia, polyphagia and polyuria.  Neurological: Negative for dizziness, tremors, seizures, syncope, facial asymmetry, speech difficulty, weakness, light-headedness, numbness and headaches.  Psychiatric/Behavioral: Negative for suicidal ideas, sleep disturbance, self-injury and dysphoric mood. The patient is nervous/anxious.     Past Medical History:  Diagnosis Date  . Allergy   . Asthma   . Glucose intolerance (impaired glucose tolerance)   . Hypertension    Past Surgical History:  Procedure Laterality Date  . BLADDER REPAIR W/ CESAREAN SECTION    . CESAREAN SECTION     NO BLADDER REPAIR   Allergies  Allergen Reactions  . Anaprox [Naproxen] Itching  . Penicillins     unknown    Social History   Social History  . Marital status: Married    Spouse name: N/A  . Number of children: N/A  . Years of education: N/A   Occupational History  . Not on file.   Social History Main Topics  . Smoking status: Never Smoker  . Smokeless tobacco: Not on file  . Alcohol use 0.0 oz/week  . Drug use: Unknown  . Sexual activity: Yes   Other Topics Concern  . 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 Hypertension Mother   . Diabetes Mother   . Hypertension Father        Objective:    BP 132/88   Pulse 84   Temp 98.1 F (36.7 C) (Oral)   Resp 18   Ht 5' 4.25" (1.632 m)   Wt 195 lb (88.5 kg)   LMP 12/27/2015 (Approximate)   SpO2 100%    BMI 33.21 kg/m  Physical Exam  Constitutional: She is oriented to person, place, and time. She appears well-developed and well-nourished. No distress.  HENT:  Head: Normocephalic and atraumatic.  Right Ear: External ear normal.  Left Ear: External ear normal.  Nose: Nose normal.  Mouth/Throat: Oropharynx is clear and moist.  Eyes: Conjunctivae and EOM are normal. Pupils are equal, round, and reactive to light.  Neck: Normal range of motion. Neck supple. Carotid bruit is not present. No thyromegaly present.  Cardiovascular: Normal rate, regular rhythm, normal heart sounds and intact distal pulses.  Exam reveals no gallop and no friction rub.   No murmur heard. Pulmonary/Chest: Effort normal and breath sounds normal. She has no wheezes. She has no rales.  Abdominal: Soft. Bowel sounds are normal. She exhibits no distension and no mass. There is generalized tenderness. There is no rebound, no guarding and no CVA tenderness.  Lymphadenopathy:    She has no cervical adenopathy.  Neurological: She is alert and oriented to person, place, and time. No cranial nerve deficit. She exhibits normal muscle tone. Coordination normal.  Skin: Skin is warm and dry. No rash noted. She is not diaphoretic. No erythema. No pallor.  Psychiatric: She has a normal mood and affect. Her behavior is normal. Judgment and thought content normal.        Assessment & Plan:   1. Essential hypertension   2. Other abnormal glucose   3. Nausea without vomiting   4. Seasonal allergic rhinitis due to pollen   5. Perimenopausal   6. Encounter for medication review    -New. -rx for Maxzide provided to control blood pressure; provided education to patient the importance of taking medication daily. -obtain labs. -discussed risk benefit of HRT; desires trial of Effexor for hot flashes and moodiness. -rx for Prilosec provided to help with morning nausea; obtain labs; take Prilosec daily. If no improvement, refer to  GI.   Orders Placed This Encounter  Procedures  . CBC with Differential/Platelet  . Comprehensive metabolic panel  . Hemoglobin A1c  . TSH  . T4, free  . POCT urine pregnancy  . POCT urinalysis dipstick  . POCT Microscopic Urinalysis ([facility])   Meds ordered this encounter  Medications  . triamterene-hydrochlorothiazide (MAXZIDE-25) 37.5-25 MG tablet    Sig: Take 1 tablet by mouth daily.    Dispense:  90 tablet    Refill:  3  . venlafaxine (EFFEXOR) 37.5 MG tablet    Sig: Take 1 tablet (37.5 mg total) by mouth 2 (two) times daily.    Dispense:  30 tablet    Refill:  5  . omeprazole (PRILOSEC) 20 MG capsule    Sig: Take 1 capsule (20 mg total) by mouth daily.  Dispense:  30 capsule    Refill:  3    No Follow-up on file.    Ceceilia Cephus Paulita Fujita, M.D. [facility] [address] [place], [place]  [address] [phone] phone [phone] fax

## 2016-01-19 NOTE — Patient Instructions (Signed)
     IF you received an x-ray today, you will receive an invoice from [facility]. Please contact [facility] at [phone] with questions or concerns regarding your invoice.   IF you received labwork today, you will receive an invoice from [facility]. Please contact [facility] at [phone] with questions or concerns regarding your invoice.   Our billing staff will not be able to assist you with questions regarding bills from these companies.  You will be contacted with the lab results as soon as they are available. The fastest way to get your results is to activate your My Chart account. Instructions are located on the last page of this paperwork. If you have not heard from us regarding the results in 2 weeks, please contact this office.      

## 2016-02-18 ENCOUNTER — Other Ambulatory Visit: Payer: Self-pay

## 2016-02-18 DIAGNOSIS — I10 Essential (primary) hypertension: Secondary | ICD-10-CM

## 2016-02-18 MED ORDER — AMLODIPINE BESYLATE 5 MG PO TABS: 5.0000 mg | ORAL_TABLET | Freq: Every day | ORAL | 0 refills | Status: DC

## 2016-04-13 ENCOUNTER — Encounter: Payer: Self-pay | Admitting: Family Medicine

## 2016-04-13 LAB — HM MAMMOGRAPHY: HM Mammogram: NORMAL (ref 0–4)

## 2016-06-12 ENCOUNTER — Other Ambulatory Visit: Payer: Self-pay | Admitting: Family Medicine

## 2016-06-12 DIAGNOSIS — I10 Essential (primary) hypertension: Secondary | ICD-10-CM

## 2016-07-31 ENCOUNTER — Ambulatory Visit: Payer: BLUE CROSS/BLUE SHIELD

## 2016-10-01 ENCOUNTER — Other Ambulatory Visit: Payer: Self-pay | Admitting: Physician Assistant

## 2016-10-01 DIAGNOSIS — I10 Essential (primary) hypertension: Secondary | ICD-10-CM

## 2016-10-01 NOTE — Telephone Encounter (Signed)
Patient notified via My Chart. Left message to schedule visit before this supply is exhausted.  Meds ordered this encounter  Medications  . amLODipine (NORVASC) 5 MG tablet    Sig: TAKE 1 TABLET (5 MG TOTAL) BY MOUTH DAILY.    Dispense:  90 tablet    Refill:  0

## 2017-02-19 ENCOUNTER — Other Ambulatory Visit: Payer: Self-pay | Admitting: Physician Assistant

## 2017-02-19 DIAGNOSIS — I10 Essential (primary) hypertension: Secondary | ICD-10-CM

## 2017-02-28 ENCOUNTER — Other Ambulatory Visit: Payer: Self-pay | Admitting: Physician Assistant

## 2017-02-28 DIAGNOSIS — I10 Essential (primary) hypertension: Secondary | ICD-10-CM

## 2017-03-04 NOTE — Telephone Encounter (Signed)
mychart message sent to pt about making an apt for more refills °

## 2017-04-08 ENCOUNTER — Other Ambulatory Visit: Payer: Self-pay | Admitting: Family Medicine

## 2017-04-08 DIAGNOSIS — I10 Essential (primary) hypertension: Secondary | ICD-10-CM

## 2017-04-08 DIAGNOSIS — Z79899 Other long term (current) drug therapy: Secondary | ICD-10-CM

## 2017-05-18 ENCOUNTER — Other Ambulatory Visit: Payer: Self-pay | Admitting: Family Medicine

## 2017-05-18 DIAGNOSIS — I10 Essential (primary) hypertension: Secondary | ICD-10-CM

## 2017-09-11 ENCOUNTER — Other Ambulatory Visit: Payer: Self-pay | Admitting: Family Medicine

## 2017-09-11 DIAGNOSIS — Z79899 Other long term (current) drug therapy: Secondary | ICD-10-CM

## 2017-09-11 DIAGNOSIS — I10 Essential (primary) hypertension: Secondary | ICD-10-CM

## 2017-12-12 ENCOUNTER — Encounter: Payer: Self-pay | Admitting: Family Medicine

## 2019-06-29 ENCOUNTER — Other Ambulatory Visit: Payer: Self-pay

## 2019-06-29 DIAGNOSIS — Z20822 Contact with and (suspected) exposure to covid-19: Secondary | ICD-10-CM

## 2019-07-02 LAB — NOVEL CORONAVIRUS, NAA: SARS-CoV-2, NAA: NOT DETECTED
